# Patient Record
Sex: Female | Born: 1945 | Race: White | Hispanic: No | Smoking: Never smoker
Health system: Southern US, Community
[De-identification: ages and names within clinical notes are randomized; demographics above are authoritative.]

## PROBLEM LIST (undated history)

## (undated) DIAGNOSIS — Z85828 Personal history of other malignant neoplasm of skin: Secondary | ICD-10-CM

## (undated) DIAGNOSIS — I10 Essential (primary) hypertension: Secondary | ICD-10-CM

## (undated) DIAGNOSIS — K219 Gastro-esophageal reflux disease without esophagitis: Secondary | ICD-10-CM

## (undated) DIAGNOSIS — K2970 Gastritis, unspecified, without bleeding: Secondary | ICD-10-CM

## (undated) DIAGNOSIS — I209 Angina pectoris, unspecified: Secondary | ICD-10-CM

## (undated) DIAGNOSIS — Z86018 Personal history of other benign neoplasm: Secondary | ICD-10-CM

## (undated) DIAGNOSIS — L570 Actinic keratosis: Secondary | ICD-10-CM

## (undated) DIAGNOSIS — C801 Malignant (primary) neoplasm, unspecified: Secondary | ICD-10-CM

## (undated) DIAGNOSIS — Z8619 Personal history of other infectious and parasitic diseases: Secondary | ICD-10-CM

## (undated) DIAGNOSIS — G43909 Migraine, unspecified, not intractable, without status migrainosus: Secondary | ICD-10-CM

## (undated) DIAGNOSIS — G5600 Carpal tunnel syndrome, unspecified upper limb: Secondary | ICD-10-CM

## (undated) HISTORY — PX: CHOLECYSTECTOMY: SHX55

## (undated) HISTORY — DX: Migraine, unspecified, not intractable, without status migrainosus: G43.909

## (undated) HISTORY — PX: UPPER GI ENDOSCOPY: SHX6162

## (undated) HISTORY — DX: Carpal tunnel syndrome, unspecified upper limb: G56.00

## (undated) HISTORY — PX: ORIF SHOULDER FRACTURE: SHX5035

## (undated) HISTORY — PX: FOOT SURGERY: SHX648

## (undated) HISTORY — PX: FRACTURE SURGERY: SHX138

## (undated) HISTORY — PX: GALLBLADDER SURGERY: SHX652

## (undated) HISTORY — DX: Essential (primary) hypertension: I10

## (undated) HISTORY — DX: Angina pectoris, unspecified: I20.9

## (undated) HISTORY — DX: Actinic keratosis: L57.0

## (undated) HISTORY — DX: Gastro-esophageal reflux disease without esophagitis: K21.9

## (undated) HISTORY — DX: Personal history of other infectious and parasitic diseases: Z86.19

## (undated) HISTORY — PX: OTHER SURGICAL HISTORY: SHX169

## (undated) HISTORY — DX: Gastritis, unspecified, without bleeding: K29.70

---
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
## 1984-03-10 HISTORY — PX: VAGINAL HYSTERECTOMY: SUR661

## 1998-09-27 ENCOUNTER — Other Ambulatory Visit: Admission: RE | Admit: 1998-09-27 | Discharge: 1998-09-27 | Payer: Self-pay | Admitting: *Deleted

## 1998-10-16 ENCOUNTER — Other Ambulatory Visit: Admission: RE | Admit: 1998-10-16 | Discharge: 1998-10-16 | Payer: Self-pay | Admitting: *Deleted

## 2004-09-18 ENCOUNTER — Ambulatory Visit: Payer: Self-pay

## 2004-12-19 ENCOUNTER — Ambulatory Visit: Payer: Self-pay | Admitting: Family Medicine

## 2006-02-04 ENCOUNTER — Ambulatory Visit: Payer: Self-pay | Admitting: Family Medicine

## 2006-02-06 ENCOUNTER — Ambulatory Visit: Payer: Self-pay | Admitting: Family Medicine

## 2006-04-22 ENCOUNTER — Ambulatory Visit: Payer: Self-pay | Admitting: Otolaryngology

## 2006-08-07 ENCOUNTER — Ambulatory Visit: Payer: Self-pay | Admitting: Unknown Physician Specialty

## 2007-01-07 DIAGNOSIS — Z86018 Personal history of other benign neoplasm: Secondary | ICD-10-CM

## 2007-01-07 HISTORY — DX: Personal history of other benign neoplasm: Z86.018

## 2007-07-27 ENCOUNTER — Ambulatory Visit: Payer: Self-pay | Admitting: Family Medicine

## 2008-07-27 ENCOUNTER — Ambulatory Visit: Payer: Self-pay | Admitting: Family Medicine

## 2009-08-01 ENCOUNTER — Ambulatory Visit: Payer: Self-pay | Admitting: Family Medicine

## 2010-09-25 ENCOUNTER — Ambulatory Visit: Payer: Self-pay | Admitting: Family Medicine

## 2011-09-29 ENCOUNTER — Ambulatory Visit: Payer: Self-pay | Admitting: Family Medicine

## 2012-05-07 ENCOUNTER — Encounter: Payer: Self-pay | Admitting: *Deleted

## 2012-05-11 ENCOUNTER — Ambulatory Visit (INDEPENDENT_AMBULATORY_CARE_PROVIDER_SITE_OTHER): Payer: BC Managed Care – PPO | Admitting: Cardiovascular Disease

## 2012-05-11 ENCOUNTER — Encounter: Payer: Self-pay | Admitting: Cardiovascular Disease

## 2012-05-11 VITALS — BP 140/90 | HR 72 | Ht 64.5 in | Wt 234.3 lb

## 2012-05-11 DIAGNOSIS — R079 Chest pain, unspecified: Secondary | ICD-10-CM | POA: Insufficient documentation

## 2012-05-11 DIAGNOSIS — R0602 Shortness of breath: Secondary | ICD-10-CM

## 2012-05-11 DIAGNOSIS — I10 Essential (primary) hypertension: Secondary | ICD-10-CM | POA: Insufficient documentation

## 2012-05-11 NOTE — Assessment & Plan Note (Signed)
She has atypical chest pain of unclear etiology. Her physical exam is unremarkable and resting EKG is normal. Differential diagnosis include musculoskeletal or chest pain due to GERD. Atypical angina is also a possibility. Thus, I recommend further evaluation with a pharmacologic nuclear stress test. She is not able to exercise on a treadmill due to lower extremity arthritis. In the meanwhile, continue aspirin daily and metoprolol.

## 2012-05-11 NOTE — Assessment & Plan Note (Signed)
Her blood pressure is reasonably controlled on current medications.

## 2012-05-11 NOTE — Patient Instructions (Addendum)
Your physician has requested that you have a lexiscan myoview. For further information please visit [email or web address]. Please follow instruction sheet, as given.  Follow up as needed 

## 2012-05-11 NOTE — Progress Notes (Signed)
Primary care physician: Dr. Jerl Mina  HPI  This is a pleasant 67 year old female who is self-referred for evaluation of chest pain. She has no previous cardiac history. She reports having a stress test more than 7 years ago for atypical chest pain. She has known history of hypertension reasonably controlled with medications. She does have history of elevated blood sugar only in the morning but no diabetes. She is not a smoker. There is family history of coronary artery disease but not prematurely. She had chest pain which started about 10 days ago on a Sunday. It happened at rest and was described as aching and tightness feeling in the left upper chest area. It lasted all afternoon and persisted at the next day. There was no heartburn. The symptoms were different from her reflux. There was no radiation. She had milder recurrent chest discomfort since then some of which has been with physical activities. She has no associated dyspnea, nausea or diaphoresis. The discomfort also get worse with certain movements and bending down. She tries to exercise regularly but is limited by arthritis and cannot exercise on a treadmill due to that.  Allergies  Allergen Reactions  . Aleve (Naproxen Sodium)   . Augmentin (Amoxicillin-Pot Clavulanate)   . Naprosyn (Naproxen)      Current Outpatient Prescriptions on File Prior to Visit  Medication Sig Dispense Refill  . metoprolol succinate (TOPROL-XL) 50 MG 24 hr tablet Take 50 mg by mouth daily. Take with or immediately following a meal.      . valsartan-hydrochlorothiazide (DIOVAN-HCT) 160-12.5 MG per tablet Take 1 tablet by mouth daily.       No current facility-administered medications on file prior to visit.     Past Medical History  Diagnosis Date  . History of hepatitis A   . Migraines   . Carpal tunnel syndrome   . Other and unspecified angina pectoris   . Gastritis   . Unspecified essential hypertension   . GERD (gastroesophageal reflux  disease)      Past Surgical History  Procedure Laterality Date  . Basel cell removal    . Gallbladder surgery    . Vaginal hysterectomy  1986    endometriosis  . Orif shoulder fracture    . Foot surgery      x2  . Colonoscopy    . Upper gi endoscopy       Family History  Problem Relation Age of Onset  . Hypertension Mother   . Arrhythmia Brother   . Hyperlipidemia Brother   . Hypertension Brother      History   Social History  . Marital Status: Unknown    Spouse Name: N/A    Number of Children: N/A  . Years of Education: N/A   Occupational History  . Not on file.   Social History Main Topics  . Smoking status: Never Smoker   . Smokeless tobacco: Not on file  . Alcohol Use: No  . Drug Use: No  . Sexually Active: Not on file   Other Topics Concern  . Not on file   Social History Narrative  . No narrative on file     ROS Constitutional: Negative for fever, chills, diaphoresis, activity change, appetite change and fatigue.  HENT: Negative for hearing loss, nosebleeds, congestion, sore throat, facial swelling, drooling, trouble swallowing, neck pain, voice change, sinus pressure and tinnitus.  Eyes: Negative for photophobia, pain, discharge and visual disturbance.  Respiratory: Negative for apnea, cough , shortness of breath and  wheezing.  Cardiovascular: Negative for chest pain, palpitations and leg swelling.  Gastrointestinal: Negative for nausea, vomiting, abdominal pain, diarrhea, constipation, blood in stool and abdominal distention.  Genitourinary: Negative for dysuria, urgency, frequency, hematuria and decreased urine volume.  Musculoskeletal: Negative for myalgias, back pain, joint swelling, arthralgias and gait problem.  Skin: Negative for color change, pallor, rash and wound.  Neurological: Negative for dizziness, tremors, seizures, syncope, speech difficulty, weakness, light-headedness, numbness and headaches.  Psychiatric/Behavioral: Negative for  suicidal ideas, hallucinations, behavioral problems and agitation. The patient is not nervous/anxious.     PHYSICAL EXAM   BP 140/90  Pulse 72  Ht 5' 4.5" (1.638 m)  Wt 234 lb 5 oz (106.283 kg)  BMI 39.61 kg/m2 Constitutional: She is oriented to person, place, and time. She appears well-developed and well-nourished. No distress.  HENT: No nasal discharge.  Head: Normocephalic and atraumatic.  Eyes: Pupils are equal and round. Right eye exhibits no discharge. Left eye exhibits no discharge.  Neck: Normal range of motion. Neck supple. No JVD present. No thyromegaly present.  Cardiovascular: Normal rate, regular rhythm, normal heart sounds. Exam reveals no gallop and no friction rub. No murmur heard.  Pulmonary/Chest: Effort normal and breath sounds normal. No stridor. No respiratory distress. She has no wheezes. She has no rales. She exhibits no tenderness.  Abdominal: Soft. Bowel sounds are normal. She exhibits no distension. There is no tenderness. There is no rebound and no guarding.  Musculoskeletal: Normal range of motion. She exhibits no edema and no tenderness.  Neurological: She is alert and oriented to person, place, and time. Coordination normal.  Skin: Skin is warm and dry. No rash noted. She is not diaphoretic. No erythema. No pallor.  Psychiatric: She has a normal mood and affect. Her behavior is normal. Judgment and thought content normal.     EKG: Normal sinus rhythm with no significant ST or T wave changes.   ASSESSMENT AND PLAN

## 2012-05-21 ENCOUNTER — Ambulatory Visit: Payer: Self-pay | Admitting: Cardiovascular Disease

## 2012-05-21 DIAGNOSIS — R079 Chest pain, unspecified: Secondary | ICD-10-CM

## 2012-05-24 ENCOUNTER — Other Ambulatory Visit: Payer: Self-pay

## 2012-05-24 ENCOUNTER — Telehealth: Payer: Self-pay

## 2012-05-24 DIAGNOSIS — R079 Chest pain, unspecified: Secondary | ICD-10-CM

## 2012-05-24 NOTE — Telephone Encounter (Signed)
If she is still having chest pain, then the best option would be to proceed with cardiac cath. CTA of the coronary arteries is probably an option. But there is a possibility of artifacts on that too.

## 2012-05-24 NOTE — Telephone Encounter (Signed)
Pt informed Says she is still having intermittent CP Would like the other test done I told her I would find out what test and call her back

## 2012-05-24 NOTE — Telephone Encounter (Signed)
Message copied by [facility], Jayliana Valencia E on Mon May 24, 2012 10:26 AM ------      Message from: Lorine Bears A      Created: Fri May 21, 2012  7:44 PM       Her stress test was overall OK but there was one area of the heart that I could not see very well because of breast shadow. Let me know if she is still having chest pain as we might need to order a different test.  ------

## 2012-05-24 NOTE — Telephone Encounter (Signed)
See below

## 2012-05-25 ENCOUNTER — Other Ambulatory Visit: Payer: Self-pay

## 2012-05-25 ENCOUNTER — Other Ambulatory Visit: Payer: Self-pay | Admitting: Cardiovascular Disease

## 2012-05-25 DIAGNOSIS — R079 Chest pain, unspecified: Secondary | ICD-10-CM

## 2012-05-25 NOTE — Telephone Encounter (Signed)
Faxing info to # provided

## 2012-05-25 NOTE — Telephone Encounter (Signed)
Pt informed She wishes to proceed with CTA coronary arteries I will call to schedule this for anytime prior to 3/27 or after 3/30 and call her back

## 2012-05-25 NOTE — Telephone Encounter (Signed)
I spoke with dept and they ask that I fax info to [phone]

## 2012-05-25 NOTE — Telephone Encounter (Signed)
Attempted to reach Crystal [phone]) at [facility] to schedule Cardiac CT No answer

## 2012-05-25 NOTE — Telephone Encounter (Signed)
Attempted to schedule cardiac CTA at [facility] busy Will call back at [phone]

## 2012-05-25 NOTE — Telephone Encounter (Signed)
No answer at [facility] MRI scheduling

## 2012-05-25 NOTE — Telephone Encounter (Signed)
lmtcb

## 2012-05-26 ENCOUNTER — Ambulatory Visit (INDEPENDENT_AMBULATORY_CARE_PROVIDER_SITE_OTHER): Payer: BC Managed Care – PPO

## 2012-05-26 ENCOUNTER — Encounter: Payer: Self-pay | Admitting: *Deleted

## 2012-05-26 ENCOUNTER — Other Ambulatory Visit: Payer: Self-pay

## 2012-05-26 DIAGNOSIS — R079 Chest pain, unspecified: Secondary | ICD-10-CM

## 2012-05-26 NOTE — Telephone Encounter (Signed)
Per charanda in [place] office this has been scheduled and pt is aware Bmp today

## 2012-05-27 LAB — BASIC METABOLIC PANEL
BUN/Creatinine Ratio: 17 (ref 11–26)
BUN: 18 mg/dL (ref 8–27)
CO2: 20 mmol/L (ref 19–28)
Calcium: 8.9 mg/dL (ref 8.6–10.2)
Chloride: 101 mmol/L (ref 97–108)
Creatinine, Ser: 1.07 mg/dL — ABNORMAL HIGH (ref 0.57–1.00)
GFR calc Af Amer: 63 mL/min/{1.73_m2} (ref >59)
GFR calc non Af Amer: 55 mL/min/{1.73_m2} — ABNORMAL LOW (ref >59)
Glucose: 74 mg/dL (ref 65–99)
Potassium: 4.1 mmol/L (ref 3.5–5.2)
Sodium: 140 mmol/L (ref 134–144)

## 2012-06-04 ENCOUNTER — Ambulatory Visit (HOSPITAL_COMMUNITY)
Admission: RE | Admit: 2012-06-04 | Discharge: 2012-06-04 | Disposition: A | Discharge status: Home / Self Care | Payer: BC Managed Care – PPO | Source: Ambulatory Visit | Attending: Cardiovascular Disease | Admitting: Cardiovascular Disease

## 2012-06-04 DIAGNOSIS — R079 Chest pain, unspecified: Secondary | ICD-10-CM | POA: Insufficient documentation

## 2012-06-04 MED ORDER — METOPROLOL TARTRATE 1 MG/ML IV SOLN
5.0000 mg | INTRAVENOUS | Status: DC | PRN
  Administered 2012-06-04 (×2): 5 mg via INTRAVENOUS
  Filled 2012-06-04: qty 5

## 2012-06-04 MED ORDER — IOHEXOL 350 MG/ML SOLN
100.0000 mL | Freq: Once | INTRAVENOUS | Status: AC | PRN
  Administered 2012-06-04: 80 mL via INTRAVENOUS

## 2012-06-04 MED ORDER — NITROGLYCERIN 0.4 MG SL SUBL
SUBLINGUAL_TABLET | SUBLINGUAL | Status: AC
  Administered 2012-06-04: 14:00:00
  Filled 2012-06-04: qty 25

## 2012-06-04 MED ORDER — METOPROLOL TARTRATE 1 MG/ML IV SOLN
INTRAVENOUS | Status: AC
  Administered 2012-06-04: 5 mg
  Filled 2012-06-04: qty 15

## 2012-09-29 ENCOUNTER — Ambulatory Visit: Payer: Self-pay | Admitting: Family Medicine

## 2013-01-13 ENCOUNTER — Other Ambulatory Visit: Payer: Self-pay

## 2013-11-01 ENCOUNTER — Ambulatory Visit: Payer: Self-pay | Admitting: Family Medicine

## 2014-09-04 ENCOUNTER — Other Ambulatory Visit: Payer: Self-pay

## 2014-10-30 ENCOUNTER — Other Ambulatory Visit: Payer: Self-pay | Admitting: Family Medicine

## 2014-10-30 DIAGNOSIS — Z1231 Encounter for screening mammogram for malignant neoplasm of breast: Secondary | ICD-10-CM

## 2014-11-06 ENCOUNTER — Ambulatory Visit
Admission: RE | Admit: 2014-11-06 | Discharge: 2014-11-06 | Disposition: A | Discharge status: Home / Self Care | Payer: Medicare Other | Source: Ambulatory Visit | Attending: Family Medicine | Admitting: Family Medicine

## 2014-11-06 ENCOUNTER — Other Ambulatory Visit: Payer: Self-pay | Admitting: Family Medicine

## 2014-11-06 DIAGNOSIS — Z1231 Encounter for screening mammogram for malignant neoplasm of breast: Secondary | ICD-10-CM

## 2014-11-06 HISTORY — DX: Malignant (primary) neoplasm, unspecified: C80.1

## 2015-04-05 DIAGNOSIS — H1089 Other conjunctivitis: Secondary | ICD-10-CM | POA: Diagnosis not present

## 2015-04-12 DIAGNOSIS — H1089 Other conjunctivitis: Secondary | ICD-10-CM | POA: Diagnosis not present

## 2015-04-23 DIAGNOSIS — I10 Essential (primary) hypertension: Secondary | ICD-10-CM | POA: Diagnosis not present

## 2015-04-23 DIAGNOSIS — E119 Type 2 diabetes mellitus without complications: Secondary | ICD-10-CM | POA: Diagnosis not present

## 2015-05-01 DIAGNOSIS — N390 Urinary tract infection, site not specified: Secondary | ICD-10-CM | POA: Diagnosis not present

## 2015-05-01 DIAGNOSIS — E785 Hyperlipidemia, unspecified: Secondary | ICD-10-CM | POA: Diagnosis not present

## 2015-05-01 DIAGNOSIS — R739 Hyperglycemia, unspecified: Secondary | ICD-10-CM | POA: Diagnosis not present

## 2015-05-01 DIAGNOSIS — I10 Essential (primary) hypertension: Secondary | ICD-10-CM | POA: Diagnosis not present

## 2015-05-01 DIAGNOSIS — Z0000 Encounter for general adult medical examination without abnormal findings: Secondary | ICD-10-CM | POA: Diagnosis not present

## 2015-06-25 DIAGNOSIS — H524 Presbyopia: Secondary | ICD-10-CM | POA: Diagnosis not present

## 2015-08-21 DIAGNOSIS — J019 Acute sinusitis, unspecified: Secondary | ICD-10-CM | POA: Diagnosis not present

## 2015-08-21 DIAGNOSIS — B9689 Other specified bacterial agents as the cause of diseases classified elsewhere: Secondary | ICD-10-CM | POA: Diagnosis not present

## 2015-10-30 DIAGNOSIS — R739 Hyperglycemia, unspecified: Secondary | ICD-10-CM | POA: Diagnosis not present

## 2015-11-06 DIAGNOSIS — I10 Essential (primary) hypertension: Secondary | ICD-10-CM | POA: Diagnosis not present

## 2015-11-06 DIAGNOSIS — R739 Hyperglycemia, unspecified: Secondary | ICD-10-CM | POA: Diagnosis not present

## 2015-11-07 ENCOUNTER — Other Ambulatory Visit: Payer: Self-pay | Admitting: Family Medicine

## 2015-11-07 DIAGNOSIS — Z1231 Encounter for screening mammogram for malignant neoplasm of breast: Secondary | ICD-10-CM

## 2015-11-27 ENCOUNTER — Ambulatory Visit
Admission: RE | Admit: 2015-11-27 | Discharge: 2015-11-27 | Disposition: A | Discharge status: Home / Self Care | Payer: PPO | Source: Ambulatory Visit | Attending: Family Medicine | Admitting: Family Medicine

## 2015-11-27 ENCOUNTER — Other Ambulatory Visit: Payer: Self-pay | Admitting: Family Medicine

## 2015-11-27 DIAGNOSIS — Z1231 Encounter for screening mammogram for malignant neoplasm of breast: Secondary | ICD-10-CM | POA: Insufficient documentation

## 2016-02-04 DIAGNOSIS — I8393 Asymptomatic varicose veins of bilateral lower extremities: Secondary | ICD-10-CM | POA: Diagnosis not present

## 2016-02-04 DIAGNOSIS — L739 Follicular disorder, unspecified: Secondary | ICD-10-CM | POA: Diagnosis not present

## 2016-02-04 DIAGNOSIS — Z1283 Encounter for screening for malignant neoplasm of skin: Secondary | ICD-10-CM | POA: Diagnosis not present

## 2016-02-04 DIAGNOSIS — D1800 Hemangioma unspecified site: Secondary | ICD-10-CM | POA: Diagnosis not present

## 2016-02-04 DIAGNOSIS — L821 Other seborrheic keratosis: Secondary | ICD-10-CM | POA: Diagnosis not present

## 2016-02-04 DIAGNOSIS — D485 Neoplasm of uncertain behavior of skin: Secondary | ICD-10-CM | POA: Diagnosis not present

## 2016-02-04 DIAGNOSIS — Z85828 Personal history of other malignant neoplasm of skin: Secondary | ICD-10-CM | POA: Diagnosis not present

## 2016-02-04 DIAGNOSIS — L820 Inflamed seborrheic keratosis: Secondary | ICD-10-CM | POA: Diagnosis not present

## 2016-02-04 DIAGNOSIS — L570 Actinic keratosis: Secondary | ICD-10-CM | POA: Diagnosis not present

## 2016-02-04 DIAGNOSIS — D229 Melanocytic nevi, unspecified: Secondary | ICD-10-CM | POA: Diagnosis not present

## 2016-02-04 DIAGNOSIS — L578 Other skin changes due to chronic exposure to nonionizing radiation: Secondary | ICD-10-CM | POA: Diagnosis not present

## 2016-02-04 DIAGNOSIS — L812 Freckles: Secondary | ICD-10-CM | POA: Diagnosis not present

## 2016-03-17 DIAGNOSIS — J019 Acute sinusitis, unspecified: Secondary | ICD-10-CM | POA: Diagnosis not present

## 2016-04-29 DIAGNOSIS — I10 Essential (primary) hypertension: Secondary | ICD-10-CM | POA: Diagnosis not present

## 2016-04-29 DIAGNOSIS — R739 Hyperglycemia, unspecified: Secondary | ICD-10-CM | POA: Diagnosis not present

## 2016-04-30 DIAGNOSIS — R3989 Other symptoms and signs involving the genitourinary system: Secondary | ICD-10-CM | POA: Diagnosis not present

## 2016-05-06 DIAGNOSIS — E785 Hyperlipidemia, unspecified: Secondary | ICD-10-CM | POA: Diagnosis not present

## 2016-05-06 DIAGNOSIS — Z0000 Encounter for general adult medical examination without abnormal findings: Secondary | ICD-10-CM | POA: Diagnosis not present

## 2016-05-06 DIAGNOSIS — R300 Dysuria: Secondary | ICD-10-CM | POA: Diagnosis not present

## 2016-05-06 DIAGNOSIS — E119 Type 2 diabetes mellitus without complications: Secondary | ICD-10-CM | POA: Diagnosis not present

## 2016-05-06 DIAGNOSIS — I10 Essential (primary) hypertension: Secondary | ICD-10-CM | POA: Diagnosis not present

## 2016-05-06 DIAGNOSIS — F439 Reaction to severe stress, unspecified: Secondary | ICD-10-CM | POA: Diagnosis not present

## 2016-05-08 DIAGNOSIS — L821 Other seborrheic keratosis: Secondary | ICD-10-CM | POA: Diagnosis not present

## 2016-05-08 DIAGNOSIS — L570 Actinic keratosis: Secondary | ICD-10-CM | POA: Diagnosis not present

## 2016-05-08 DIAGNOSIS — L820 Inflamed seborrheic keratosis: Secondary | ICD-10-CM | POA: Diagnosis not present

## 2016-05-08 DIAGNOSIS — L578 Other skin changes due to chronic exposure to nonionizing radiation: Secondary | ICD-10-CM | POA: Diagnosis not present

## 2016-06-24 DIAGNOSIS — H04552 Acquired stenosis of left nasolacrimal duct: Secondary | ICD-10-CM | POA: Diagnosis not present

## 2016-07-03 DIAGNOSIS — H01004 Unspecified blepharitis left upper eyelid: Secondary | ICD-10-CM | POA: Diagnosis not present

## 2016-08-11 DIAGNOSIS — L820 Inflamed seborrheic keratosis: Secondary | ICD-10-CM | POA: Diagnosis not present

## 2016-08-11 DIAGNOSIS — L578 Other skin changes due to chronic exposure to nonionizing radiation: Secondary | ICD-10-CM | POA: Diagnosis not present

## 2016-08-11 DIAGNOSIS — L821 Other seborrheic keratosis: Secondary | ICD-10-CM | POA: Diagnosis not present

## 2016-08-11 DIAGNOSIS — L570 Actinic keratosis: Secondary | ICD-10-CM | POA: Diagnosis not present

## 2016-09-01 DIAGNOSIS — E113212 Type 2 diabetes mellitus with mild nonproliferative diabetic retinopathy with macular edema, left eye: Secondary | ICD-10-CM | POA: Diagnosis not present

## 2016-09-01 DIAGNOSIS — H04123 Dry eye syndrome of bilateral lacrimal glands: Secondary | ICD-10-CM | POA: Diagnosis not present

## 2016-09-03 DIAGNOSIS — Z0000 Encounter for general adult medical examination without abnormal findings: Secondary | ICD-10-CM | POA: Diagnosis not present

## 2016-09-16 DIAGNOSIS — J029 Acute pharyngitis, unspecified: Secondary | ICD-10-CM | POA: Diagnosis not present

## 2016-09-22 ENCOUNTER — Other Ambulatory Visit: Payer: Self-pay | Admitting: Medical

## 2016-09-22 ENCOUNTER — Other Ambulatory Visit: Payer: Self-pay | Admitting: Family Medicine

## 2016-09-22 DIAGNOSIS — E041 Nontoxic single thyroid nodule: Secondary | ICD-10-CM

## 2016-09-23 ENCOUNTER — Ambulatory Visit: Payer: PPO

## 2016-09-23 DIAGNOSIS — E041 Nontoxic single thyroid nodule: Secondary | ICD-10-CM | POA: Diagnosis not present

## 2016-09-26 DIAGNOSIS — H6123 Impacted cerumen, bilateral: Secondary | ICD-10-CM | POA: Diagnosis not present

## 2016-09-26 DIAGNOSIS — E041 Nontoxic single thyroid nodule: Secondary | ICD-10-CM | POA: Diagnosis not present

## 2016-10-13 DIAGNOSIS — H5712 Ocular pain, left eye: Secondary | ICD-10-CM | POA: Diagnosis not present

## 2016-10-22 DIAGNOSIS — E041 Nontoxic single thyroid nodule: Secondary | ICD-10-CM | POA: Diagnosis not present

## 2016-10-23 DIAGNOSIS — E042 Nontoxic multinodular goiter: Secondary | ICD-10-CM | POA: Diagnosis not present

## 2016-10-28 DIAGNOSIS — E119 Type 2 diabetes mellitus without complications: Secondary | ICD-10-CM | POA: Diagnosis not present

## 2016-11-03 DIAGNOSIS — E041 Nontoxic single thyroid nodule: Secondary | ICD-10-CM | POA: Diagnosis not present

## 2016-11-03 DIAGNOSIS — I10 Essential (primary) hypertension: Secondary | ICD-10-CM | POA: Diagnosis not present

## 2016-11-03 DIAGNOSIS — R1310 Dysphagia, unspecified: Secondary | ICD-10-CM | POA: Diagnosis not present

## 2016-11-03 DIAGNOSIS — E119 Type 2 diabetes mellitus without complications: Secondary | ICD-10-CM | POA: Diagnosis not present

## 2017-01-09 DIAGNOSIS — Z8371 Family history of colonic polyps: Secondary | ICD-10-CM | POA: Diagnosis not present

## 2017-01-09 DIAGNOSIS — Z86010 Personal history of colonic polyps: Secondary | ICD-10-CM | POA: Diagnosis not present

## 2017-01-09 DIAGNOSIS — Z800 Family history of malignant neoplasm of digestive organs: Secondary | ICD-10-CM | POA: Diagnosis not present

## 2017-01-09 DIAGNOSIS — R1310 Dysphagia, unspecified: Secondary | ICD-10-CM | POA: Diagnosis not present

## 2017-01-26 ENCOUNTER — Other Ambulatory Visit: Payer: Self-pay | Admitting: Family Medicine

## 2017-01-26 DIAGNOSIS — Z1231 Encounter for screening mammogram for malignant neoplasm of breast: Secondary | ICD-10-CM

## 2017-02-02 DIAGNOSIS — L578 Other skin changes due to chronic exposure to nonionizing radiation: Secondary | ICD-10-CM | POA: Diagnosis not present

## 2017-02-02 DIAGNOSIS — D1801 Hemangioma of skin and subcutaneous tissue: Secondary | ICD-10-CM | POA: Diagnosis not present

## 2017-02-02 DIAGNOSIS — D229 Melanocytic nevi, unspecified: Secondary | ICD-10-CM | POA: Diagnosis not present

## 2017-02-02 DIAGNOSIS — L821 Other seborrheic keratosis: Secondary | ICD-10-CM | POA: Diagnosis not present

## 2017-02-02 DIAGNOSIS — L309 Dermatitis, unspecified: Secondary | ICD-10-CM | POA: Diagnosis not present

## 2017-02-02 DIAGNOSIS — L812 Freckles: Secondary | ICD-10-CM | POA: Diagnosis not present

## 2017-02-02 DIAGNOSIS — I8393 Asymptomatic varicose veins of bilateral lower extremities: Secondary | ICD-10-CM | POA: Diagnosis not present

## 2017-02-02 DIAGNOSIS — Z1283 Encounter for screening for malignant neoplasm of skin: Secondary | ICD-10-CM | POA: Diagnosis not present

## 2017-02-02 DIAGNOSIS — L918 Other hypertrophic disorders of the skin: Secondary | ICD-10-CM | POA: Diagnosis not present

## 2017-02-02 DIAGNOSIS — L570 Actinic keratosis: Secondary | ICD-10-CM | POA: Diagnosis not present

## 2017-02-02 DIAGNOSIS — Z85828 Personal history of other malignant neoplasm of skin: Secondary | ICD-10-CM | POA: Diagnosis not present

## 2017-02-26 ENCOUNTER — Ambulatory Visit
Admission: RE | Admit: 2017-02-26 | Discharge: 2017-02-26 | Disposition: A | Discharge status: Home / Self Care | Payer: PPO | Source: Ambulatory Visit | Attending: Family Medicine | Admitting: Family Medicine

## 2017-02-26 DIAGNOSIS — Z1231 Encounter for screening mammogram for malignant neoplasm of breast: Secondary | ICD-10-CM | POA: Insufficient documentation

## 2017-03-30 ENCOUNTER — Ambulatory Visit
Admission: RE | Admit: 2017-03-30 | Discharge: 2017-03-30 | Disposition: A | Discharge status: Home / Self Care | Payer: PPO | Source: Ambulatory Visit | Attending: Unknown Physician Specialty | Admitting: Unknown Physician Specialty

## 2017-03-30 ENCOUNTER — Ambulatory Visit: Payer: PPO | Admitting: Anesthesiology

## 2017-03-30 ENCOUNTER — Encounter: Payer: Self-pay | Admitting: *Deleted

## 2017-03-30 ENCOUNTER — Encounter
Admission: RE | Disposition: A | Discharge status: Home / Self Care | Payer: Self-pay | Source: Ambulatory Visit | Attending: Unknown Physician Specialty

## 2017-03-30 DIAGNOSIS — K5730 Diverticulosis of large intestine without perforation or abscess without bleeding: Secondary | ICD-10-CM | POA: Diagnosis not present

## 2017-03-30 DIAGNOSIS — K640 First degree hemorrhoids: Secondary | ICD-10-CM | POA: Diagnosis not present

## 2017-03-30 DIAGNOSIS — K2950 Unspecified chronic gastritis without bleeding: Secondary | ICD-10-CM | POA: Diagnosis not present

## 2017-03-30 DIAGNOSIS — K648 Other hemorrhoids: Secondary | ICD-10-CM | POA: Diagnosis not present

## 2017-03-30 DIAGNOSIS — K210 Gastro-esophageal reflux disease with esophagitis: Secondary | ICD-10-CM | POA: Diagnosis not present

## 2017-03-30 DIAGNOSIS — R1310 Dysphagia, unspecified: Secondary | ICD-10-CM | POA: Diagnosis not present

## 2017-03-30 DIAGNOSIS — K2970 Gastritis, unspecified, without bleeding: Secondary | ICD-10-CM | POA: Insufficient documentation

## 2017-03-30 DIAGNOSIS — K2960 Other gastritis without bleeding: Secondary | ICD-10-CM | POA: Diagnosis not present

## 2017-03-30 DIAGNOSIS — Z86010 Personal history of colonic polyps: Secondary | ICD-10-CM | POA: Diagnosis not present

## 2017-03-30 DIAGNOSIS — Z1211 Encounter for screening for malignant neoplasm of colon: Secondary | ICD-10-CM | POA: Diagnosis not present

## 2017-03-30 DIAGNOSIS — K5790 Diverticulosis of intestine, part unspecified, without perforation or abscess without bleeding: Secondary | ICD-10-CM | POA: Diagnosis not present

## 2017-03-30 HISTORY — PX: COLONOSCOPY WITH PROPOFOL: SHX5780

## 2017-03-30 HISTORY — PX: ESOPHAGOGASTRODUODENOSCOPY (EGD) WITH PROPOFOL: SHX5813

## 2017-03-30 SURGERY — ESOPHAGOGASTRODUODENOSCOPY (EGD) WITH PROPOFOL
Anesthesia: General

## 2017-03-30 MED ORDER — PROPOFOL 10 MG/ML IV BOLUS
INTRAVENOUS | Status: DC | PRN
  Administered 2017-03-30: 30 mg via INTRAVENOUS
  Administered 2017-03-30: 50 mg via INTRAVENOUS

## 2017-03-30 MED ORDER — SODIUM CHLORIDE 0.9 % IV SOLN: INTRAVENOUS | Status: DC

## 2017-03-30 MED ORDER — PROPOFOL 500 MG/50ML IV EMUL
INTRAVENOUS | Status: DC | PRN
  Administered 2017-03-30: 75 ug/kg/min via INTRAVENOUS

## 2017-03-30 MED ORDER — LIDOCAINE HCL (CARDIAC) 20 MG/ML IV SOLN
INTRAVENOUS | Status: DC | PRN
  Administered 2017-03-30: 30 mg via INTRAVENOUS

## 2017-03-30 MED ORDER — SODIUM CHLORIDE 0.9 % IV SOLN
INTRAVENOUS | Status: DC
  Administered 2017-03-30: 09:00:00 via INTRAVENOUS

## 2017-03-30 NOTE — Anesthesia Post-op Follow-up Note (Signed)
Anesthesia QCDR form completed.        

## 2017-03-30 NOTE — Anesthesia Preprocedure Evaluation (Signed)
Anesthesia Evaluation  Patient identified by MRN, date of birth, ID band Patient awake    Reviewed: Allergy & Precautions, NPO status , Patient's Chart, lab work & pertinent test results  History of Anesthesia Complications (+) PONV and history of anesthetic complications  Airway Mallampati: II  TM Distance: >3 FB Neck ROM: Full    Dental no notable dental hx.    Pulmonary neg pulmonary ROS, neg sleep apnea, neg COPD,    breath sounds clear to auscultation- rhonchi (-) wheezing      Cardiovascular hypertension, Pt. on medications (-) CAD, (-) Past MI, (-) Cardiac Stents and (-) CABG  Rhythm:Regular Rate:Normal - Systolic murmurs and - Diastolic murmurs    Neuro/Psych  Headaches, negative psych ROS   GI/Hepatic Neg liver ROS, GERD  ,  Endo/Other  negative endocrine ROSneg diabetes  Renal/GU negative Renal ROS     Musculoskeletal negative musculoskeletal ROS (+)   Abdominal (+) + obese,   Peds  Hematology negative hematology ROS (+)   Anesthesia Other Findings Past Medical History: No date: Cancer ([place])     Comment:  basal cell skin cancer No date: Carpal tunnel syndrome No date: Gastritis No date: GERD (gastroesophageal reflux disease) No date: History of hepatitis A No date: Migraines No date: Other and unspecified angina pectoris No date: Unspecified essential hypertension   Reproductive/Obstetrics                             Anesthesia Physical Anesthesia Plan  ASA: II  Anesthesia Plan: General   Post-op Pain Management:    Induction: Intravenous  PONV Risk Score and Plan: 3 and Propofol infusion  Airway Management Planned: Natural Airway  Additional Equipment:   Intra-op Plan:   Post-operative Plan:   Informed Consent: I have reviewed the patients History and Physical, chart, labs and discussed the procedure including the risks, benefits and alternatives for the  proposed anesthesia with the patient or authorized representative who has indicated his/her understanding and acceptance.   Dental advisory given  Plan Discussed with: CRNA and Anesthesiologist  Anesthesia Plan Comments:         Anesthesia Quick Evaluation

## 2017-03-30 NOTE — H&P (Signed)
Primary Care Physician:  Maryland Pink, MD Primary Gastroenterologist:  Dr. Vira Agar  Pre-Procedure History & Physical: HPI:  Cheryl Key is a 72 y.o. female is here for an endoscopy and colonoscopy.   Past Medical History:  Diagnosis Date  . Cancer ([place])    basal cell skin cancer  . Carpal tunnel syndrome   . Gastritis   . GERD (gastroesophageal reflux disease)   . History of hepatitis A   . Migraines   . Other and unspecified angina pectoris   . Unspecified essential hypertension     Past Surgical History:  Procedure Laterality Date  . basel cell removal    . colonoscopy    . FOOT SURGERY     x2  . GALLBLADDER SURGERY    . ORIF SHOULDER FRACTURE    . UPPER GI ENDOSCOPY    . VAGINAL HYSTERECTOMY  1986   endometriosis    Prior to Admission medications   Medication Sig Start Date End Date Taking? Authorizing Provider  calcium-vitamin D (OSCAL WITH D) 500-200 MG-UNIT tablet Take 1 tablet by mouth 2 (two) times daily.   Yes [provider]  fluticasone (FLONASE) 50 MCG/ACT nasal spray Place 2 sprays into both nostrils daily.   Yes [provider]  glucosamine-chondroitin 500-400 MG tablet Take 1 tablet by mouth 3 (three) times daily.   Yes [provider]  losartan-hydrochlorothiazide (HYZAAR) 50-12.5 MG tablet Take 1 tablet by mouth daily.   Yes [provider]  metoprolol succinate (TOPROL-XL) 50 MG 24 hr tablet Take 50 mg by mouth daily. Take with or immediately following a meal.   Yes [provider]  Multiple Vitamin (MULTIVITAMIN) tablet Take 1 tablet by mouth daily.   Yes [provider]    Allergies as of 02/23/2017 - Review Complete 06/04/2012  Allergen Reaction Noted  . Aleve [naproxen sodium]  05/07/2012  . Augmentin [amoxicillin-pot clavulanate]  05/07/2012  . Naprosyn [naproxen]  05/07/2012    Family History  Problem Relation Age of Onset  . Hypertension Mother   . Arrhythmia Brother   .  Hyperlipidemia Brother   . Hypertension Brother   . Breast cancer Neg Hx     Social History   Socioeconomic History  . Marital status: Married    Spouse name: Not on file  . Number of children: Not on file  . Years of education: Not on file  . Highest education level: Not on file  Social Needs  . Financial resource strain: Not on file  . Food insecurity - worry: Not on file  . Food insecurity - inability: Not on file  . Transportation needs - medical: Not on file  . Transportation needs - non-medical: Not on file  Occupational History  . Not on file  Tobacco Use  . Smoking status: Never Smoker  . Smokeless tobacco: Never Used  Substance and Sexual Activity  . Alcohol use: No  . Drug use: No  . Sexual activity: Not on file  Other Topics Concern  . Not on file  Social History Narrative  . Not on file    Review of Systems: See HPI, otherwise negative ROS  Physical Exam: BP 134/75   Pulse 85   Temp (!) 97 F (36.1 C) (Tympanic)   Resp 14   Ht 5' 4.5" (1.638 m)   Wt 103.4 kg (228 lb)   SpO2 100%   BMI 38.53 kg/m  General:   Alert,  pleasant and cooperative in NAD Head:  Normocephalic and atraumatic. Neck:  Supple; no masses or thyromegaly. Lungs:  Clear throughout to auscultation.    Heart:  Regular rate and rhythm. Abdomen:  Soft, nontender and nondistended. Normal bowel sounds, without guarding, and without rebound.   Neurologic:  Alert and  oriented x4;  grossly normal neurologically.  Impression/Plan: Cheryl Key is here for an endoscopy and colonoscopy to be performed for [facility] colon polyps and dysphagia.  Risks, benefits, limitations, and alternatives regarding  endoscopy and colonoscopy have been reviewed with the patient.  Questions have been answered.  All parties agreeable.   Gaylyn Cheers, MD  03/30/2017, 9:22 AM

## 2017-03-30 NOTE — Op Note (Addendum)
[facility] Gastroenterology Patient Name: Cheryl Key Procedure Date: 03/30/2017 9:14 AM MRN: [id number] Account #: [id number] Date of Birth: 02/03/1946 Admit Type: Outpatient Age: 72 Room: [facility] ENDO ROOM 1 Gender: Female Note Status: Finalized Procedure:            Upper GI endoscopy Indications:          Dysphagia Providers:            Manya Silvas, MD Referring MD:         Irven Easterly. Kary Kos, MD (Referring MD) Medicines:            Propofol per Anesthesia Complications:        No immediate complications. Procedure:            Pre-Anesthesia Assessment:                       - After reviewing the risks and benefits, the patient                        was deemed in satisfactory condition to undergo the                        procedure.                       After obtaining informed consent, the endoscope was                        passed under direct vision. Throughout the procedure,                        the patient's blood pressure, pulse, and oxygen                        saturations were monitored continuously. The Endoscope                        was introduced through the mouth, and advanced to the                        second part of duodenum. The upper GI endoscopy was                        accomplished without difficulty. The patient tolerated                        the procedure well. Findings:      LA Grade A (one or more mucosal breaks less than 5 mm, not extending       between tops of 2 mucosal folds) esophagitis with no bleeding was found       40 cm from the incisors. Biopsies were taken with a cold forceps for       histology. At the end of the procedure I passed a guide wire and pullled       the scope and then passed a 59F and a 58F Savary dilators with mild       resistance.      Localized mild inflammation characterized by erythema and granularity       was found on the greater curvature of the gastric antrum. Biopsies were   taken with a cold  forceps for histology. Biopsies were taken with a cold       forceps for Helicobacter pylori testing.      The examined duodenum was normal. Impression:           - LA Grade A reflux esophagitis. Rule out Barrett's                        esophagus. Biopsied.                       - Gastritis. Biopsied.                       - Normal examined duodenum. Recommendation:       - Await pathology results.                       - Perform a colonoscopy as previously scheduled. Manya Silvas, MD 03/30/2017 9:41:42 AM This report has been signed electronically. Number of Addenda: 0 Note Initiated On: 03/30/2017 9:14 AM      [facility]

## 2017-03-30 NOTE — Anesthesia Postprocedure Evaluation (Signed)
Anesthesia Post Note  Patient: Cheryl Key  Procedure(s) Performed: ESOPHAGOGASTRODUODENOSCOPY (EGD) WITH PROPOFOL (N/A ) COLONOSCOPY WITH PROPOFOL (N/A )  Patient location during evaluation: Endoscopy Anesthesia Type: General Level of consciousness: awake and alert and oriented Pain management: pain level controlled Vital Signs Assessment: post-procedure vital signs reviewed and stable Respiratory status: spontaneous breathing, nonlabored ventilation and respiratory function stable Cardiovascular status: blood pressure returned to baseline and stable Postop Assessment: no signs of nausea or vomiting Anesthetic complications: no     Last Vitals:  Vitals:   03/30/17 1020 03/30/17 1030  BP: 138/76 132/74  Pulse: 76 76  Resp: 15 15  Temp:    SpO2: 100% 100%    Last Pain:  Vitals:   03/30/17 1030  TempSrc:   PainSc: 3                  Lakrisha Iseman

## 2017-03-30 NOTE — Op Note (Signed)
[facility] Gastroenterology Patient Name: Cheryl Key Procedure Date: 03/30/2017 9:13 AM MRN: [id number] Account #: [id number] Date of Birth: Dec 28, 1945 Admit Type: Outpatient Age: 72 Room: [facility] ENDO ROOM 1 Gender: Female Note Status: Finalized Procedure:            Colonoscopy Indications:          High risk colon cancer surveillance: Personal history                        of colonic polyps Providers:            Manya Silvas, MD Medicines:            Propofol per Anesthesia Complications:        No immediate complications. Procedure:            Pre-Anesthesia Assessment:                       - After reviewing the risks and benefits, the patient                        was deemed in satisfactory condition to undergo the                        procedure.                       After obtaining informed consent, the colonoscope was                        passed under direct vision. Throughout the procedure,                        the patient's blood pressure, pulse, and oxygen                        saturations were monitored continuously. The                        Colonoscope was introduced through the anus and                        advanced to the the cecum, identified by appendiceal                        orifice and ileocecal valve. The colonoscopy was                        performed without difficulty. The patient tolerated the                        procedure well. The quality of the bowel preparation                        was excellent. Findings:      A few medium-mouthed diverticula were found in the sigmoid colon.      Internal hemorrhoids were found during endoscopy. The hemorrhoids were       small and Grade I (internal hemorrhoids that do not prolapse).      The exam was otherwise without abnormality. Impression:           - Diverticulosis  in the sigmoid colon.                       - Internal hemorrhoids.                       - The  examination was otherwise normal.                       - No specimens collected. Recommendation:       - Repeat colonoscopy in 5 years for surveillance. Manya Silvas, MD 03/30/2017 9:59:08 AM This report has been signed electronically. Number of Addenda: 0 Note Initiated On: 03/30/2017 9:13 AM Scope Withdrawal Time: 0 hours 5 minutes 37 seconds  Total Procedure Duration: 0 hours 12 minutes 35 seconds       [facility]

## 2017-03-30 NOTE — Transfer of Care (Signed)
Immediate Anesthesia Transfer of Care Note  Patient: Cheryl Key  Procedure(s) Performed: ESOPHAGOGASTRODUODENOSCOPY (EGD) WITH PROPOFOL (N/A ) COLONOSCOPY WITH PROPOFOL (N/A )  Patient Location: PACU and Endoscopy Unit  Anesthesia Type:General  Level of Consciousness: awake, alert  and oriented  Airway & Oxygen Therapy: Patient Spontanous Breathing  Post-op Assessment: Report given to RN and Post -op Vital signs reviewed and stable  Post vital signs: Reviewed and stable  Last Vitals:  Vitals:   03/30/17 0835  BP: 134/75  Pulse: 85  Resp: 14  Temp: (!) 36.1 C  SpO2: 100%    Last Pain:  Vitals:   03/30/17 0835  TempSrc: Tympanic         Complications: No apparent anesthesia complications

## 2017-03-31 ENCOUNTER — Encounter: Payer: Self-pay | Admitting: Unknown Physician Specialty

## 2017-03-31 LAB — SURGICAL PATHOLOGY

## 2017-05-19 DIAGNOSIS — I10 Essential (primary) hypertension: Secondary | ICD-10-CM | POA: Diagnosis not present

## 2017-05-19 DIAGNOSIS — Z79899 Other long term (current) drug therapy: Secondary | ICD-10-CM | POA: Diagnosis not present

## 2017-06-04 DIAGNOSIS — M7632 Iliotibial band syndrome, left leg: Secondary | ICD-10-CM | POA: Diagnosis not present

## 2017-06-04 DIAGNOSIS — M25562 Pain in left knee: Secondary | ICD-10-CM | POA: Insufficient documentation

## 2017-06-04 DIAGNOSIS — M1712 Unilateral primary osteoarthritis, left knee: Secondary | ICD-10-CM | POA: Diagnosis not present

## 2017-06-09 DIAGNOSIS — I10 Essential (primary) hypertension: Secondary | ICD-10-CM | POA: Diagnosis not present

## 2017-06-11 DIAGNOSIS — M7632 Iliotibial band syndrome, left leg: Secondary | ICD-10-CM | POA: Diagnosis not present

## 2017-06-11 DIAGNOSIS — M25562 Pain in left knee: Secondary | ICD-10-CM | POA: Diagnosis not present

## 2017-06-15 ENCOUNTER — Other Ambulatory Visit: Payer: Self-pay | Admitting: Orthopedic Surgery

## 2017-06-15 DIAGNOSIS — M25562 Pain in left knee: Secondary | ICD-10-CM

## 2017-06-17 ENCOUNTER — Ambulatory Visit
Admission: RE | Admit: 2017-06-17 | Discharge: 2017-06-17 | Disposition: A | Discharge status: Home / Self Care | Payer: PPO | Source: Ambulatory Visit | Attending: Orthopedic Surgery | Admitting: Orthopedic Surgery

## 2017-06-17 DIAGNOSIS — M25562 Pain in left knee: Secondary | ICD-10-CM

## 2017-06-17 DIAGNOSIS — M23222 Derangement of posterior horn of medial meniscus due to old tear or injury, left knee: Secondary | ICD-10-CM | POA: Diagnosis not present

## 2017-06-18 DIAGNOSIS — S83241A Other tear of medial meniscus, current injury, right knee, initial encounter: Secondary | ICD-10-CM | POA: Diagnosis not present

## 2017-07-07 DIAGNOSIS — I10 Essential (primary) hypertension: Secondary | ICD-10-CM | POA: Diagnosis not present

## 2017-07-07 DIAGNOSIS — E119 Type 2 diabetes mellitus without complications: Secondary | ICD-10-CM | POA: Diagnosis not present

## 2017-07-14 DIAGNOSIS — Z0000 Encounter for general adult medical examination without abnormal findings: Secondary | ICD-10-CM | POA: Diagnosis not present

## 2017-07-14 DIAGNOSIS — E119 Type 2 diabetes mellitus without complications: Secondary | ICD-10-CM | POA: Diagnosis not present

## 2017-07-14 DIAGNOSIS — I10 Essential (primary) hypertension: Secondary | ICD-10-CM | POA: Diagnosis not present

## 2017-07-24 DIAGNOSIS — M1711 Unilateral primary osteoarthritis, right knee: Secondary | ICD-10-CM | POA: Diagnosis not present

## 2017-07-24 DIAGNOSIS — G8929 Other chronic pain: Secondary | ICD-10-CM | POA: Diagnosis not present

## 2017-08-05 DIAGNOSIS — M94262 Chondromalacia, left knee: Secondary | ICD-10-CM | POA: Diagnosis not present

## 2017-08-05 DIAGNOSIS — M25562 Pain in left knee: Secondary | ICD-10-CM | POA: Diagnosis not present

## 2017-08-05 DIAGNOSIS — G8918 Other acute postprocedural pain: Secondary | ICD-10-CM | POA: Diagnosis not present

## 2017-08-05 DIAGNOSIS — M6752 Plica syndrome, left knee: Secondary | ICD-10-CM | POA: Diagnosis not present

## 2017-08-05 DIAGNOSIS — M659 Synovitis and tenosynovitis, unspecified: Secondary | ICD-10-CM | POA: Diagnosis not present

## 2017-08-11 DIAGNOSIS — M25662 Stiffness of left knee, not elsewhere classified: Secondary | ICD-10-CM | POA: Diagnosis not present

## 2017-08-11 DIAGNOSIS — M25562 Pain in left knee: Secondary | ICD-10-CM | POA: Diagnosis not present

## 2017-08-11 DIAGNOSIS — Z9889 Other specified postprocedural states: Secondary | ICD-10-CM | POA: Diagnosis not present

## 2017-09-15 DIAGNOSIS — M1711 Unilateral primary osteoarthritis, right knee: Secondary | ICD-10-CM | POA: Diagnosis not present

## 2017-10-14 DIAGNOSIS — R0683 Snoring: Secondary | ICD-10-CM | POA: Diagnosis not present

## 2017-10-14 DIAGNOSIS — R29818 Other symptoms and signs involving the nervous system: Secondary | ICD-10-CM | POA: Diagnosis not present

## 2017-10-14 DIAGNOSIS — M79605 Pain in left leg: Secondary | ICD-10-CM | POA: Diagnosis not present

## 2017-10-14 DIAGNOSIS — M79604 Pain in right leg: Secondary | ICD-10-CM | POA: Diagnosis not present

## 2017-10-14 DIAGNOSIS — I10 Essential (primary) hypertension: Secondary | ICD-10-CM | POA: Diagnosis not present

## 2017-11-05 ENCOUNTER — Ambulatory Visit: Payer: PPO | Attending: Neurology

## 2017-11-05 DIAGNOSIS — G4730 Sleep apnea, unspecified: Secondary | ICD-10-CM | POA: Insufficient documentation

## 2017-11-05 DIAGNOSIS — R0683 Snoring: Secondary | ICD-10-CM | POA: Diagnosis not present

## 2017-11-17 DIAGNOSIS — E119 Type 2 diabetes mellitus without complications: Secondary | ICD-10-CM | POA: Diagnosis not present

## 2017-11-17 DIAGNOSIS — R600 Localized edema: Secondary | ICD-10-CM | POA: Diagnosis not present

## 2017-11-17 DIAGNOSIS — I10 Essential (primary) hypertension: Secondary | ICD-10-CM | POA: Diagnosis not present

## 2017-12-02 ENCOUNTER — Ambulatory Visit: Payer: PPO | Attending: Neurology

## 2017-12-02 DIAGNOSIS — G4733 Obstructive sleep apnea (adult) (pediatric): Secondary | ICD-10-CM | POA: Diagnosis not present

## 2018-01-04 ENCOUNTER — Other Ambulatory Visit: Payer: Self-pay | Admitting: Otolaryngology

## 2018-01-04 DIAGNOSIS — E041 Nontoxic single thyroid nodule: Secondary | ICD-10-CM

## 2018-01-04 DIAGNOSIS — J209 Acute bronchitis, unspecified: Secondary | ICD-10-CM | POA: Diagnosis not present

## 2018-01-04 DIAGNOSIS — G4733 Obstructive sleep apnea (adult) (pediatric): Secondary | ICD-10-CM | POA: Diagnosis not present

## 2018-01-04 DIAGNOSIS — R05 Cough: Secondary | ICD-10-CM | POA: Diagnosis not present

## 2018-01-11 ENCOUNTER — Ambulatory Visit
Admission: RE | Admit: 2018-01-11 | Discharge: 2018-01-11 | Disposition: A | Discharge status: Home / Self Care | Payer: PPO | Source: Ambulatory Visit | Attending: Otolaryngology | Admitting: Otolaryngology

## 2018-01-11 DIAGNOSIS — E041 Nontoxic single thyroid nodule: Secondary | ICD-10-CM | POA: Insufficient documentation

## 2018-01-13 DIAGNOSIS — E119 Type 2 diabetes mellitus without complications: Secondary | ICD-10-CM | POA: Diagnosis not present

## 2018-01-13 DIAGNOSIS — J189 Pneumonia, unspecified organism: Secondary | ICD-10-CM | POA: Diagnosis not present

## 2018-01-13 DIAGNOSIS — R062 Wheezing: Secondary | ICD-10-CM | POA: Diagnosis not present

## 2018-01-13 DIAGNOSIS — R05 Cough: Secondary | ICD-10-CM | POA: Diagnosis not present

## 2018-01-15 DIAGNOSIS — G4733 Obstructive sleep apnea (adult) (pediatric): Secondary | ICD-10-CM | POA: Diagnosis not present

## 2018-01-27 DIAGNOSIS — R1084 Generalized abdominal pain: Secondary | ICD-10-CM | POA: Diagnosis not present

## 2018-01-27 DIAGNOSIS — R05 Cough: Secondary | ICD-10-CM | POA: Diagnosis not present

## 2018-02-11 DIAGNOSIS — L578 Other skin changes due to chronic exposure to nonionizing radiation: Secondary | ICD-10-CM | POA: Diagnosis not present

## 2018-02-11 DIAGNOSIS — D1800 Hemangioma unspecified site: Secondary | ICD-10-CM | POA: Diagnosis not present

## 2018-02-11 DIAGNOSIS — D485 Neoplasm of uncertain behavior of skin: Secondary | ICD-10-CM | POA: Diagnosis not present

## 2018-02-11 DIAGNOSIS — L820 Inflamed seborrheic keratosis: Secondary | ICD-10-CM | POA: Diagnosis not present

## 2018-02-11 DIAGNOSIS — B353 Tinea pedis: Secondary | ICD-10-CM | POA: Diagnosis not present

## 2018-02-11 DIAGNOSIS — L812 Freckles: Secondary | ICD-10-CM | POA: Diagnosis not present

## 2018-02-11 DIAGNOSIS — D225 Melanocytic nevi of trunk: Secondary | ICD-10-CM | POA: Diagnosis not present

## 2018-02-11 DIAGNOSIS — L821 Other seborrheic keratosis: Secondary | ICD-10-CM | POA: Diagnosis not present

## 2018-02-11 DIAGNOSIS — L918 Other hypertrophic disorders of the skin: Secondary | ICD-10-CM | POA: Diagnosis not present

## 2018-02-11 DIAGNOSIS — Z1283 Encounter for screening for malignant neoplasm of skin: Secondary | ICD-10-CM | POA: Diagnosis not present

## 2018-02-11 DIAGNOSIS — Z85828 Personal history of other malignant neoplasm of skin: Secondary | ICD-10-CM | POA: Diagnosis not present

## 2018-02-14 DIAGNOSIS — G4733 Obstructive sleep apnea (adult) (pediatric): Secondary | ICD-10-CM | POA: Diagnosis not present

## 2018-02-15 DIAGNOSIS — G4733 Obstructive sleep apnea (adult) (pediatric): Secondary | ICD-10-CM | POA: Diagnosis not present

## 2018-03-17 DIAGNOSIS — G4733 Obstructive sleep apnea (adult) (pediatric): Secondary | ICD-10-CM | POA: Diagnosis not present

## 2018-04-16 DIAGNOSIS — G4733 Obstructive sleep apnea (adult) (pediatric): Secondary | ICD-10-CM | POA: Diagnosis not present

## 2018-04-17 DIAGNOSIS — G4733 Obstructive sleep apnea (adult) (pediatric): Secondary | ICD-10-CM | POA: Diagnosis not present

## 2018-04-30 ENCOUNTER — Emergency Department
Admission: EM | Admit: 2018-04-30 | Discharge: 2018-04-30 | Disposition: A | Discharge status: Home / Self Care | Payer: PPO | Attending: Emergency Medicine | Admitting: Emergency Medicine

## 2018-04-30 ENCOUNTER — Other Ambulatory Visit: Payer: Self-pay

## 2018-04-30 ENCOUNTER — Encounter: Payer: Self-pay | Admitting: Emergency Medicine

## 2018-04-30 ENCOUNTER — Emergency Department: Payer: PPO

## 2018-04-30 DIAGNOSIS — I10 Essential (primary) hypertension: Secondary | ICD-10-CM | POA: Diagnosis not present

## 2018-04-30 DIAGNOSIS — R05 Cough: Secondary | ICD-10-CM | POA: Diagnosis not present

## 2018-04-30 DIAGNOSIS — M791 Myalgia, unspecified site: Secondary | ICD-10-CM | POA: Diagnosis present

## 2018-04-30 DIAGNOSIS — J101 Influenza due to other identified influenza virus with other respiratory manifestations: Secondary | ICD-10-CM | POA: Insufficient documentation

## 2018-04-30 DIAGNOSIS — Z79899 Other long term (current) drug therapy: Secondary | ICD-10-CM | POA: Insufficient documentation

## 2018-04-30 DIAGNOSIS — R110 Nausea: Secondary | ICD-10-CM | POA: Diagnosis not present

## 2018-04-30 LAB — INFLUENZA PANEL BY PCR (TYPE A & B)
Influenza A By PCR: POSITIVE — AB
Influenza B By PCR: NEGATIVE

## 2018-04-30 MED ORDER — OSELTAMIVIR PHOSPHATE 75 MG PO CAPS: 75.0000 mg | ORAL_CAPSULE | Freq: Two times a day (BID) | ORAL | 0 refills | Status: AC

## 2018-04-30 MED ORDER — PROMETHAZINE-PHENYLEPHRINE 6.25-5 MG/5ML PO SYRP: 5.0000 mL | ORAL_SOLUTION | ORAL | 0 refills | Status: DC | PRN

## 2018-04-30 NOTE — ED Provider Notes (Signed)
[facility] Emergency Department Provider Note   ____________________________________________   First MD Initiated Contact with Patient 04/30/18 [phone]     (approximate)  I have reviewed the triage vital signs and the nursing notes.   HISTORY  Chief Complaint Cough; Fever; and Generalized Body Aches    HPI Cheryl Key is a 73 y.o. female patient presents with body aches, fever, headache, and cough for the past 2 days.  Patient state nausea but no vomiting or diarrhea.  Patient the cough is productive increased with deep inspirations.  Patient is not taken flu shot for this season.  Patient rates her pain discomfort a 6/10.  Patient describes her pain is "achy".  No palliative measure for complaint.    Past Medical History:  Diagnosis Date  . Cancer ([place])    basal cell skin cancer  . Carpal tunnel syndrome   . Gastritis   . GERD (gastroesophageal reflux disease)   . History of hepatitis A   . Migraines   . Other and unspecified angina pectoris   . Unspecified essential hypertension     Patient Active Problem List   Diagnosis Date Noted  . Chest pain 05/11/2012  . Unspecified essential hypertension     Past Surgical History:  Procedure Laterality Date  . basel cell removal    . colonoscopy    . COLONOSCOPY WITH PROPOFOL N/A 03/30/2017   Procedure: COLONOSCOPY WITH PROPOFOL;  Surgeon: Manya Silvas, MD;  Location: [facility] ENDOSCOPY;  Service: Endoscopy;  Laterality: N/A;  . ESOPHAGOGASTRODUODENOSCOPY (EGD) WITH PROPOFOL N/A 03/30/2017   Procedure: ESOPHAGOGASTRODUODENOSCOPY (EGD) WITH PROPOFOL;  Surgeon: Manya Silvas, MD;  Location[facility] ENDOSCOPY;  Service: Endoscopy;  Laterality: N/A;  . FOOT SURGERY     x2  . GALLBLADDER SURGERY    . ORIF SHOULDER FRACTURE    . UPPER GI ENDOSCOPY    . VAGINAL HYSTERECTOMY  1986   endometriosis    Prior to Admission medications   Medication Sig Start Date End Date Taking? Authorizing Provider    calcium-vitamin D (OSCAL WITH D) 500-200 MG-UNIT tablet Take 1 tablet by mouth 2 (two) times daily.    [provider]  fluticasone (FLONASE) 50 MCG/ACT nasal spray Place 2 sprays into both nostrils daily.    [provider]  glucosamine-chondroitin 500-400 MG tablet Take 1 tablet by mouth 3 (three) times daily.    [provider]  losartan-hydrochlorothiazide (HYZAAR) 50-12.5 MG tablet Take 1 tablet by mouth daily.    [provider]  metoprolol succinate (TOPROL-XL) 50 MG 24 hr tablet Take 50 mg by mouth daily. Take with or immediately following a meal.    [provider]  Multiple Vitamin (MULTIVITAMIN) tablet Take 1 tablet by mouth daily.    [provider]  oseltamivir (TAMIFLU) 75 MG capsule Take 1 capsule (75 mg total) by mouth 2 (two) times daily for 5 days. 04/30/18 05/05/18  Sable Feil, PA-C  promethazine-phenylephrine (PROMETHAZINE VC) 6.25-5 MG/5ML SYRP Take 5 mLs by mouth every 4 (four) hours as needed for congestion. 04/30/18   Sable Feil, PA-C    Allergies Aleve [naproxen sodium]; Augmentin [amoxicillin-pot clavulanate]; Cefuroxime; and Naprosyn [naproxen]  Family History  Problem Relation Age of Onset  . Hypertension Mother   . Arrhythmia Brother   . Hyperlipidemia Brother   . Hypertension Brother   . Breast cancer Neg Hx     Social History Social History   Tobacco Use  . Smoking status: Never  Smoker  . Smokeless tobacco: Never Used  Substance Use Topics  . Alcohol use: No  . Drug use: No    Review of Systems Constitutional: Fever and body aches.   Eyes: No visual changes. ENT: No sore throat.  Nasal congestion. Cardiovascular: Denies chest pain. Respiratory: Denies shortness of breath.  Ducted cough. Gastrointestinal: No abdominal pain.  Nausea, no vomiting.  No diarrhea.  No constipation. Genitourinary: Negative for dysuria. Musculoskeletal: Negative for back pain. Skin: Negative for  rash. Neurological: Positive for headaches, but denies focal weakness or numbness. Endocrine:  Hypertension Allergic/Immunilogical: See allergy list. ____________________________________________   PHYSICAL EXAM:  VITAL SIGNS: ED Triage Vitals  Enc Vitals Group     BP 04/30/18 0815 118/70     Pulse Rate 04/30/18 0815 88     Resp 04/30/18 0815 16     Temp 04/30/18 0815 98.7 F (37.1 C)     Temp Source 04/30/18 0815 Oral     SpO2 04/30/18 0815 97 %     Weight 04/30/18 0812 240 lb (108.9 kg)     Height 04/30/18 0812 5\' 4"  (1.626 m)     Head Circumference --      Peak Flow --      Pain Score 04/30/18 0811 6     Pain Loc --      Pain Edu? --      Excl. in [place]? --     Constitutional: Alert and oriented. Well appearing and in no acute distress. Nose: Edematous nasal turbinates. Mouth/Throat: Mucous membranes are moist.  Oropharynx non-erythematous.  Postnasal drainage. Neck: No stridor.  Hematological/Lymphatic/Immunilogical: No cervical lymphadenopathy. Cardiovascular: Normal rate, regular rhythm. Grossly normal heart sounds.  Good peripheral circulation. Respiratory: Normal respiratory effort.  No retractions. Lungs CTAB. Gastrointestinal: Soft and nontender. No distention. No abdominal bruits. No CVA tenderness. Skin:  Skin is warm, dry and intact. No rash noted. Psychiatric: Mood and affect are normal. Speech and behavior are normal.  ____________________________________________   LABS (all labs ordered are listed, but only abnormal results are displayed)  Labs Reviewed  INFLUENZA PANEL BY PCR (TYPE A & B) - Abnormal; Notable for the following components:      Result Value   Influenza A By PCR POSITIVE (*)    All other components within normal limits   ____________________________________________  EKG  ____________________________________________  RADIOLOGY  ED MD interpretation:    Official radiology report(s): Dg Chest 2 View  Result Date:  04/30/2018 CLINICAL DATA:  Fever and productive cough EXAM: CHEST - 2 VIEW COMPARISON:  Chest CT 06/04/2012 FINDINGS: Normal heart size and mediastinal contours. There is increased density at the medial right base on the frontal view but no pneumonia based on the lateral view. No pulmonary edema. No effusion or pneumothorax. No acute osseous findings. IMPRESSION: Negative chest. Electronically Signed   By: Monte Fantasia M.D.   On: 04/30/2018 08:59    ____________________________________________   PROCEDURES  Procedure(s) performed: None  Procedures  Critical Care performed: No  ____________________________________________   INITIAL IMPRESSION / ASSESSMENT AND PLAN / ED COURSE  As part of my medical decision making, I reviewed the following data within the [place]     Patient presents with headache fever and cough for 2 days.  Patient test positive influenza A.  Patient given discharge care instruction advised take medication as directed.  Follow-up with PCP.      ____________________________________________   FINAL CLINICAL IMPRESSION(S) / ED DIAGNOSES  Final diagnoses:  Influenza  A     ED Discharge Orders         Ordered    oseltamivir (TAMIFLU) 75 MG capsule  2 times daily     04/30/18 1006    promethazine-phenylephrine (PROMETHAZINE VC) 6.25-5 MG/5ML SYRP  Every 4 hours PRN     04/30/18 1006           Note:  This document was prepared using Dragon voice recognition software and may include unintentional dictation errors.    Sable Feil, PA-C 04/30/18 1008    Delman Kitten, MD 04/30/18 [phone]

## 2018-04-30 NOTE — ED Notes (Signed)
See triage note  Presents with generalized body aches fever and prod cough  States sx's started on weds  Fever worse at night  Afebrile on arrival this am

## 2018-04-30 NOTE — ED Triage Notes (Signed)
Body aches, fever, and cough for the past few days. NAD.

## 2018-05-16 DIAGNOSIS — G4733 Obstructive sleep apnea (adult) (pediatric): Secondary | ICD-10-CM | POA: Diagnosis not present

## 2018-06-16 DIAGNOSIS — G4733 Obstructive sleep apnea (adult) (pediatric): Secondary | ICD-10-CM | POA: Diagnosis not present

## 2018-07-08 DIAGNOSIS — E119 Type 2 diabetes mellitus without complications: Secondary | ICD-10-CM | POA: Diagnosis not present

## 2018-07-15 DIAGNOSIS — K5900 Constipation, unspecified: Secondary | ICD-10-CM | POA: Diagnosis not present

## 2018-07-15 DIAGNOSIS — R609 Edema, unspecified: Secondary | ICD-10-CM | POA: Diagnosis not present

## 2018-07-15 DIAGNOSIS — E119 Type 2 diabetes mellitus without complications: Secondary | ICD-10-CM | POA: Diagnosis not present

## 2018-07-15 DIAGNOSIS — I10 Essential (primary) hypertension: Secondary | ICD-10-CM | POA: Diagnosis not present

## 2018-07-15 DIAGNOSIS — Z0000 Encounter for general adult medical examination without abnormal findings: Secondary | ICD-10-CM | POA: Diagnosis not present

## 2018-07-16 DIAGNOSIS — G4733 Obstructive sleep apnea (adult) (pediatric): Secondary | ICD-10-CM | POA: Diagnosis not present

## 2018-08-16 DIAGNOSIS — G4733 Obstructive sleep apnea (adult) (pediatric): Secondary | ICD-10-CM | POA: Diagnosis not present

## 2018-08-18 DIAGNOSIS — R200 Anesthesia of skin: Secondary | ICD-10-CM | POA: Diagnosis not present

## 2018-08-18 DIAGNOSIS — M7062 Trochanteric bursitis, left hip: Secondary | ICD-10-CM | POA: Diagnosis not present

## 2018-09-15 DIAGNOSIS — G4733 Obstructive sleep apnea (adult) (pediatric): Secondary | ICD-10-CM | POA: Diagnosis not present

## 2018-09-28 ENCOUNTER — Other Ambulatory Visit: Payer: Self-pay | Admitting: Family Medicine

## 2018-09-28 DIAGNOSIS — Z1231 Encounter for screening mammogram for malignant neoplasm of breast: Secondary | ICD-10-CM

## 2018-10-14 DIAGNOSIS — L570 Actinic keratosis: Secondary | ICD-10-CM | POA: Diagnosis not present

## 2018-10-14 DIAGNOSIS — L821 Other seborrheic keratosis: Secondary | ICD-10-CM | POA: Diagnosis not present

## 2018-10-14 DIAGNOSIS — L820 Inflamed seborrheic keratosis: Secondary | ICD-10-CM | POA: Diagnosis not present

## 2018-10-16 DIAGNOSIS — G4733 Obstructive sleep apnea (adult) (pediatric): Secondary | ICD-10-CM | POA: Diagnosis not present

## 2018-10-18 DIAGNOSIS — G4733 Obstructive sleep apnea (adult) (pediatric): Secondary | ICD-10-CM | POA: Diagnosis not present

## 2018-11-16 ENCOUNTER — Ambulatory Visit
Admission: RE | Admit: 2018-11-16 | Discharge: 2018-11-16 | Disposition: A | Discharge status: Home / Self Care | Payer: PPO | Source: Ambulatory Visit | Attending: Family Medicine | Admitting: Family Medicine

## 2018-11-16 DIAGNOSIS — Z1231 Encounter for screening mammogram for malignant neoplasm of breast: Secondary | ICD-10-CM | POA: Diagnosis not present

## 2018-11-16 DIAGNOSIS — M25552 Pain in left hip: Secondary | ICD-10-CM | POA: Diagnosis not present

## 2018-12-07 DIAGNOSIS — G4733 Obstructive sleep apnea (adult) (pediatric): Secondary | ICD-10-CM | POA: Diagnosis not present

## 2018-12-09 DIAGNOSIS — M7062 Trochanteric bursitis, left hip: Secondary | ICD-10-CM | POA: Diagnosis not present

## 2018-12-09 DIAGNOSIS — M25552 Pain in left hip: Secondary | ICD-10-CM | POA: Diagnosis not present

## 2018-12-13 DIAGNOSIS — M7062 Trochanteric bursitis, left hip: Secondary | ICD-10-CM | POA: Diagnosis not present

## 2018-12-16 DIAGNOSIS — E785 Hyperlipidemia, unspecified: Secondary | ICD-10-CM | POA: Diagnosis not present

## 2018-12-16 DIAGNOSIS — E119 Type 2 diabetes mellitus without complications: Secondary | ICD-10-CM | POA: Diagnosis not present

## 2018-12-16 DIAGNOSIS — M7062 Trochanteric bursitis, left hip: Secondary | ICD-10-CM | POA: Diagnosis not present

## 2018-12-21 DIAGNOSIS — M7062 Trochanteric bursitis, left hip: Secondary | ICD-10-CM | POA: Diagnosis not present

## 2018-12-23 DIAGNOSIS — I10 Essential (primary) hypertension: Secondary | ICD-10-CM | POA: Diagnosis not present

## 2018-12-23 DIAGNOSIS — E119 Type 2 diabetes mellitus without complications: Secondary | ICD-10-CM | POA: Diagnosis not present

## 2018-12-23 DIAGNOSIS — Z0000 Encounter for general adult medical examination without abnormal findings: Secondary | ICD-10-CM | POA: Diagnosis not present

## 2018-12-23 DIAGNOSIS — R600 Localized edema: Secondary | ICD-10-CM | POA: Diagnosis not present

## 2018-12-24 DIAGNOSIS — M7062 Trochanteric bursitis, left hip: Secondary | ICD-10-CM | POA: Diagnosis not present

## 2018-12-28 DIAGNOSIS — M7062 Trochanteric bursitis, left hip: Secondary | ICD-10-CM | POA: Diagnosis not present

## 2018-12-31 DIAGNOSIS — M7062 Trochanteric bursitis, left hip: Secondary | ICD-10-CM | POA: Diagnosis not present

## 2019-01-04 DIAGNOSIS — M7062 Trochanteric bursitis, left hip: Secondary | ICD-10-CM | POA: Diagnosis not present

## 2019-01-05 DIAGNOSIS — E041 Nontoxic single thyroid nodule: Secondary | ICD-10-CM | POA: Diagnosis not present

## 2019-01-06 DIAGNOSIS — G4733 Obstructive sleep apnea (adult) (pediatric): Secondary | ICD-10-CM | POA: Diagnosis not present

## 2019-01-07 DIAGNOSIS — M7062 Trochanteric bursitis, left hip: Secondary | ICD-10-CM | POA: Diagnosis not present

## 2019-01-11 DIAGNOSIS — G4733 Obstructive sleep apnea (adult) (pediatric): Secondary | ICD-10-CM | POA: Diagnosis not present

## 2019-01-11 DIAGNOSIS — E041 Nontoxic single thyroid nodule: Secondary | ICD-10-CM | POA: Diagnosis not present

## 2019-01-21 DIAGNOSIS — G4733 Obstructive sleep apnea (adult) (pediatric): Secondary | ICD-10-CM | POA: Diagnosis not present

## 2019-02-06 DIAGNOSIS — G4733 Obstructive sleep apnea (adult) (pediatric): Secondary | ICD-10-CM | POA: Diagnosis not present

## 2019-02-16 DIAGNOSIS — D224 Melanocytic nevi of scalp and neck: Secondary | ICD-10-CM | POA: Diagnosis not present

## 2019-02-16 DIAGNOSIS — Z85828 Personal history of other malignant neoplasm of skin: Secondary | ICD-10-CM

## 2019-02-16 DIAGNOSIS — D235 Other benign neoplasm of skin of trunk: Secondary | ICD-10-CM | POA: Diagnosis not present

## 2019-02-16 DIAGNOSIS — D229 Melanocytic nevi, unspecified: Secondary | ICD-10-CM | POA: Diagnosis not present

## 2019-02-16 DIAGNOSIS — D1801 Hemangioma of skin and subcutaneous tissue: Secondary | ICD-10-CM | POA: Diagnosis not present

## 2019-02-16 DIAGNOSIS — L821 Other seborrheic keratosis: Secondary | ICD-10-CM | POA: Diagnosis not present

## 2019-02-16 DIAGNOSIS — L918 Other hypertrophic disorders of the skin: Secondary | ICD-10-CM | POA: Diagnosis not present

## 2019-02-16 DIAGNOSIS — H00012 Hordeolum externum right lower eyelid: Secondary | ICD-10-CM | POA: Diagnosis not present

## 2019-02-16 DIAGNOSIS — D485 Neoplasm of uncertain behavior of skin: Secondary | ICD-10-CM | POA: Diagnosis not present

## 2019-02-16 DIAGNOSIS — C44619 Basal cell carcinoma of skin of left upper limb, including shoulder: Secondary | ICD-10-CM | POA: Diagnosis not present

## 2019-02-16 DIAGNOSIS — B353 Tinea pedis: Secondary | ICD-10-CM | POA: Diagnosis not present

## 2019-02-16 DIAGNOSIS — Z1283 Encounter for screening for malignant neoplasm of skin: Secondary | ICD-10-CM | POA: Diagnosis not present

## 2019-02-16 DIAGNOSIS — D692 Other nonthrombocytopenic purpura: Secondary | ICD-10-CM | POA: Diagnosis not present

## 2019-02-16 HISTORY — DX: Personal history of other malignant neoplasm of skin: Z85.828

## 2019-02-21 DIAGNOSIS — D229 Melanocytic nevi, unspecified: Secondary | ICD-10-CM

## 2019-02-21 HISTORY — DX: Melanocytic nevi, unspecified: D22.9

## 2019-03-08 DIAGNOSIS — G4733 Obstructive sleep apnea (adult) (pediatric): Secondary | ICD-10-CM | POA: Diagnosis not present

## 2019-04-04 DIAGNOSIS — H2513 Age-related nuclear cataract, bilateral: Secondary | ICD-10-CM | POA: Diagnosis not present

## 2019-04-08 DIAGNOSIS — G4733 Obstructive sleep apnea (adult) (pediatric): Secondary | ICD-10-CM | POA: Diagnosis not present

## 2019-04-12 DIAGNOSIS — C44519 Basal cell carcinoma of skin of other part of trunk: Secondary | ICD-10-CM | POA: Diagnosis not present

## 2019-04-12 DIAGNOSIS — L988 Other specified disorders of the skin and subcutaneous tissue: Secondary | ICD-10-CM | POA: Diagnosis not present

## 2019-04-19 DIAGNOSIS — Z4802 Encounter for removal of sutures: Secondary | ICD-10-CM | POA: Diagnosis not present

## 2019-04-22 DIAGNOSIS — G4733 Obstructive sleep apnea (adult) (pediatric): Secondary | ICD-10-CM | POA: Diagnosis not present

## 2019-05-07 DIAGNOSIS — Z23 Encounter for immunization: Secondary | ICD-10-CM | POA: Diagnosis not present

## 2019-05-08 DIAGNOSIS — G4733 Obstructive sleep apnea (adult) (pediatric): Secondary | ICD-10-CM | POA: Diagnosis not present

## 2019-06-16 DIAGNOSIS — E119 Type 2 diabetes mellitus without complications: Secondary | ICD-10-CM | POA: Diagnosis not present

## 2019-06-23 DIAGNOSIS — R600 Localized edema: Secondary | ICD-10-CM | POA: Diagnosis not present

## 2019-06-23 DIAGNOSIS — I10 Essential (primary) hypertension: Secondary | ICD-10-CM | POA: Diagnosis not present

## 2019-06-23 DIAGNOSIS — E1165 Type 2 diabetes mellitus with hyperglycemia: Secondary | ICD-10-CM | POA: Diagnosis not present

## 2019-07-21 DIAGNOSIS — G4733 Obstructive sleep apnea (adult) (pediatric): Secondary | ICD-10-CM | POA: Diagnosis not present

## 2019-09-04 DIAGNOSIS — G4733 Obstructive sleep apnea (adult) (pediatric): Secondary | ICD-10-CM | POA: Diagnosis not present

## 2019-09-10 DIAGNOSIS — S61012A Laceration without foreign body of left thumb without damage to nail, initial encounter: Secondary | ICD-10-CM | POA: Diagnosis not present

## 2019-09-10 DIAGNOSIS — W260XXA Contact with knife, initial encounter: Secondary | ICD-10-CM | POA: Diagnosis not present

## 2019-09-15 DIAGNOSIS — E1165 Type 2 diabetes mellitus with hyperglycemia: Secondary | ICD-10-CM | POA: Diagnosis not present

## 2019-09-22 DIAGNOSIS — I10 Essential (primary) hypertension: Secondary | ICD-10-CM | POA: Diagnosis not present

## 2019-09-22 DIAGNOSIS — Z4802 Encounter for removal of sutures: Secondary | ICD-10-CM | POA: Diagnosis not present

## 2019-09-22 DIAGNOSIS — E119 Type 2 diabetes mellitus without complications: Secondary | ICD-10-CM | POA: Diagnosis not present

## 2019-10-19 DIAGNOSIS — G4733 Obstructive sleep apnea (adult) (pediatric): Secondary | ICD-10-CM | POA: Diagnosis not present

## 2019-10-19 DIAGNOSIS — M1711 Unilateral primary osteoarthritis, right knee: Secondary | ICD-10-CM | POA: Diagnosis not present

## 2019-11-01 IMAGING — MG MM DIGITAL SCREENING BILAT W/ TOMO W/ CAD
6 of 10 series · 6 of 30 positions shown · non-contrast
Comparison: Previous exam(s).

CLINICAL DATA: Screening.

EXAM:
DIGITAL SCREENING BILATERAL MAMMOGRAM WITH TOMO AND CAD

[L CC synth-2D]
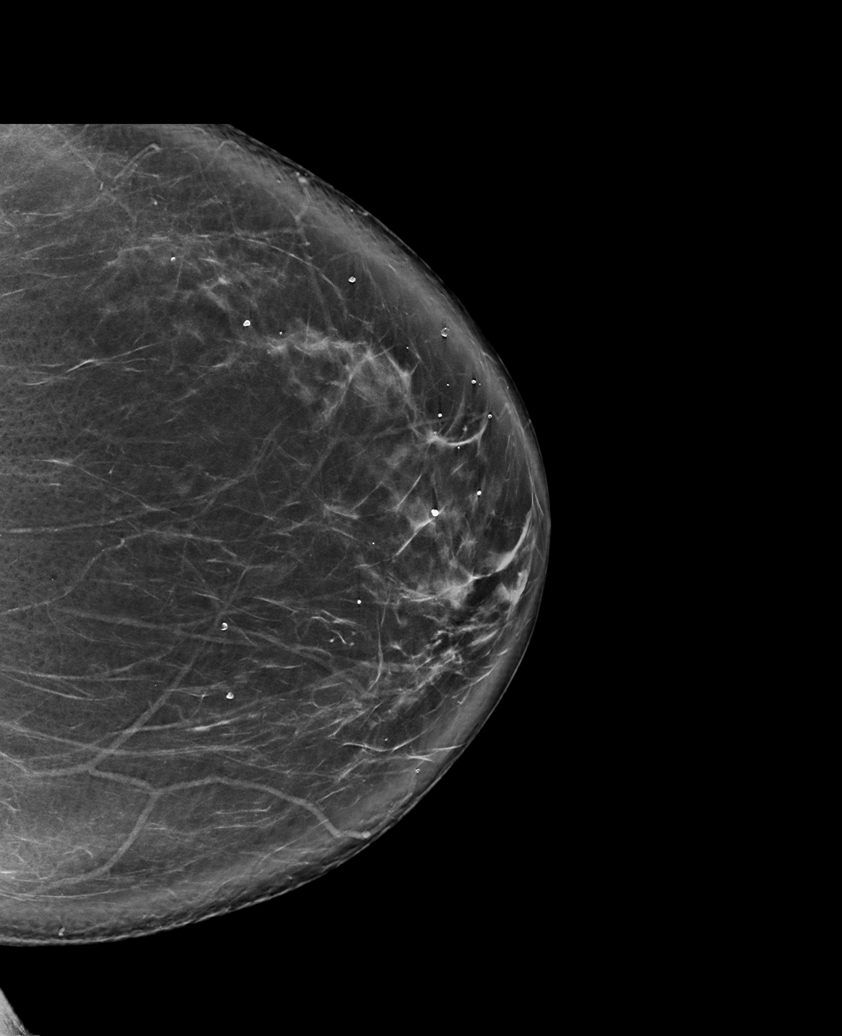

[L MLO synth-2D (1 of 2)]
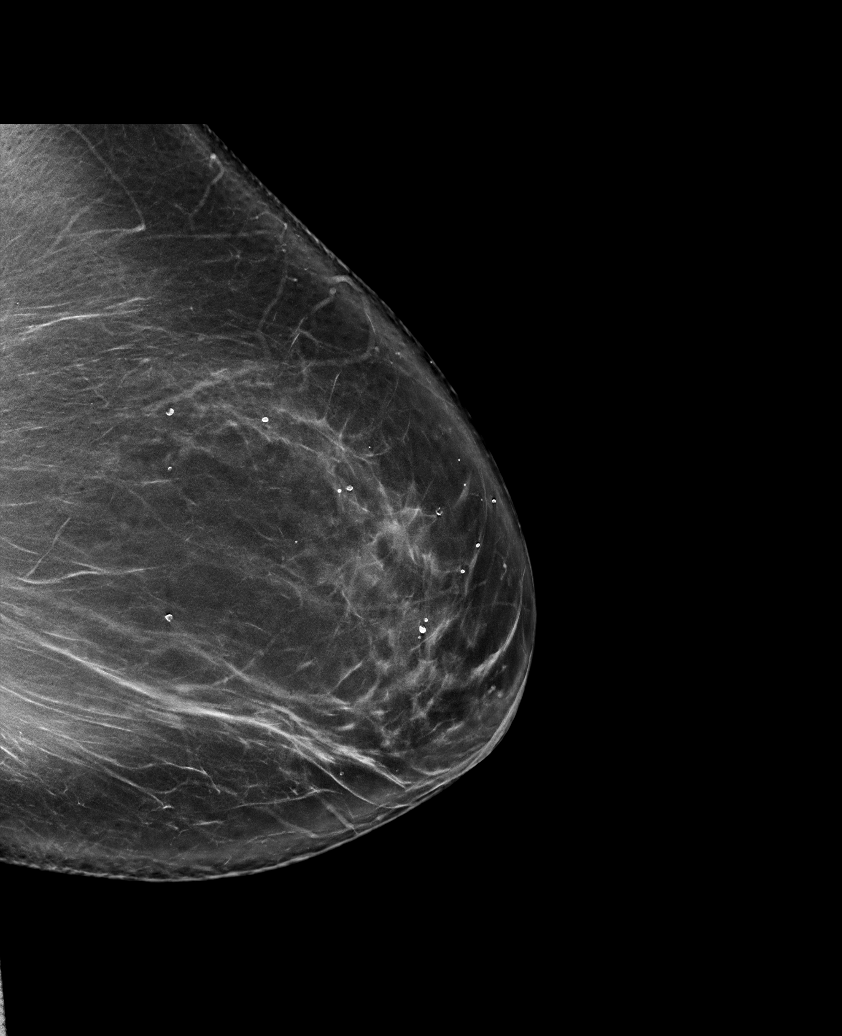

[L MLO synth-2D (2 of 2)]
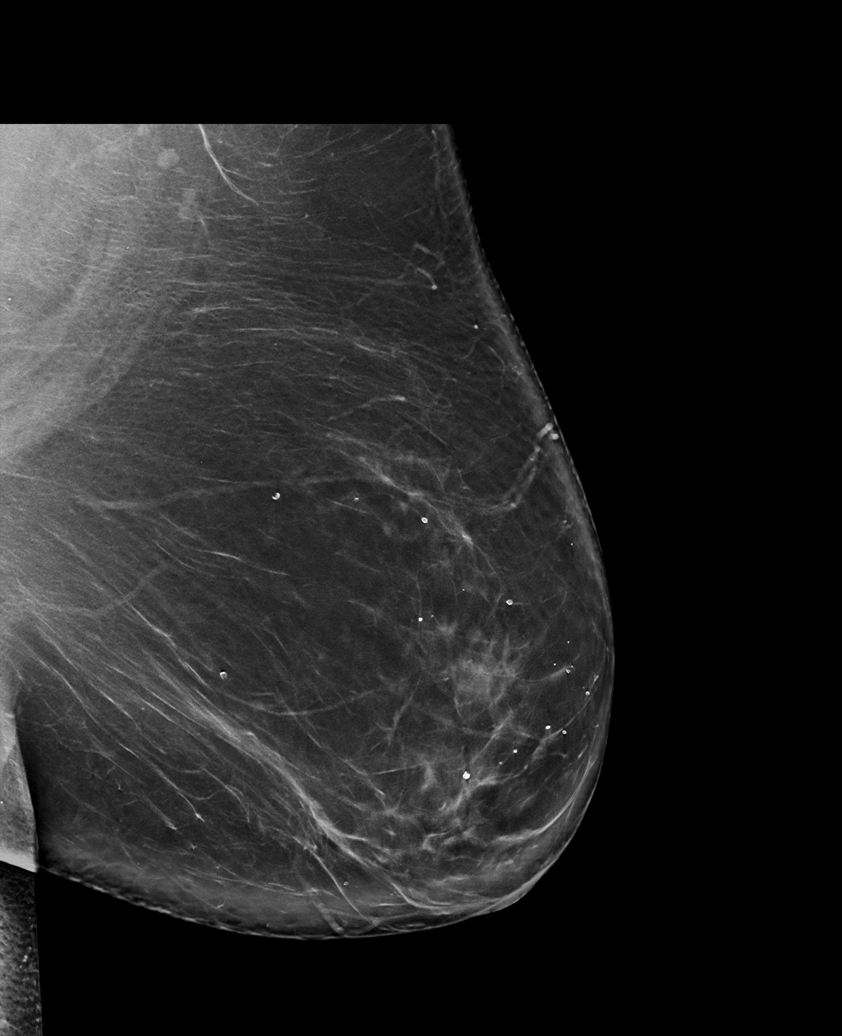

[R CC synth-2D]
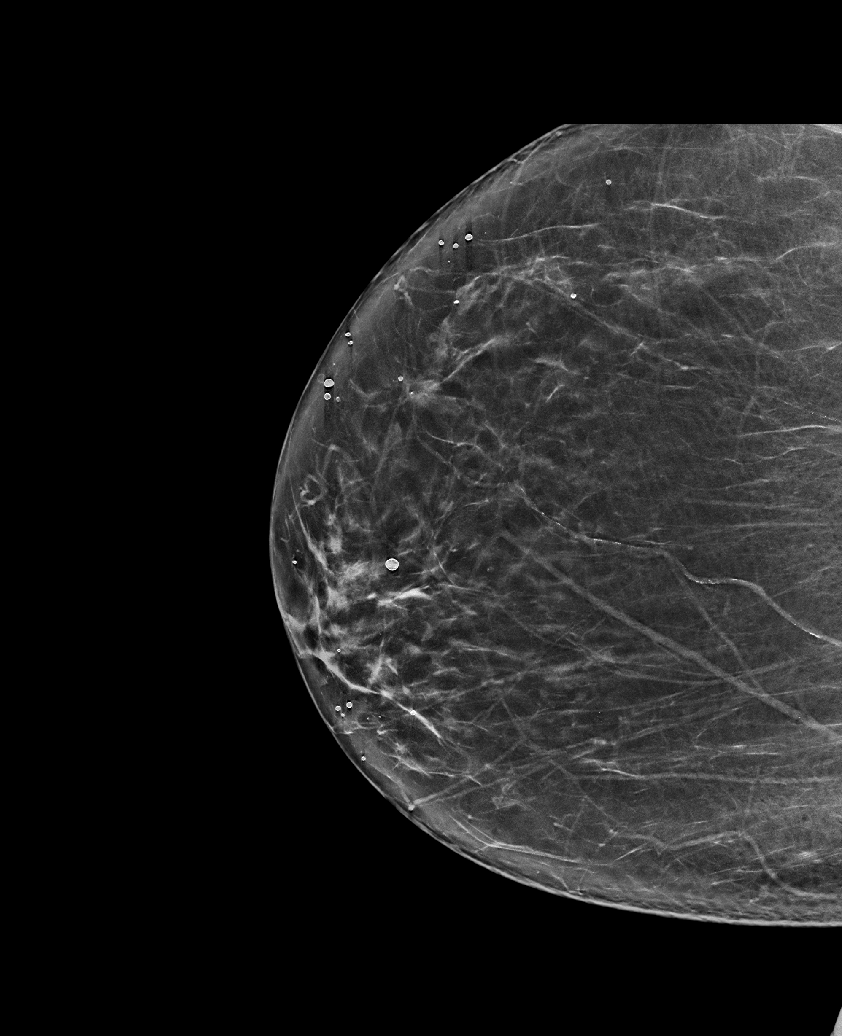

[R MLO synth-2D]
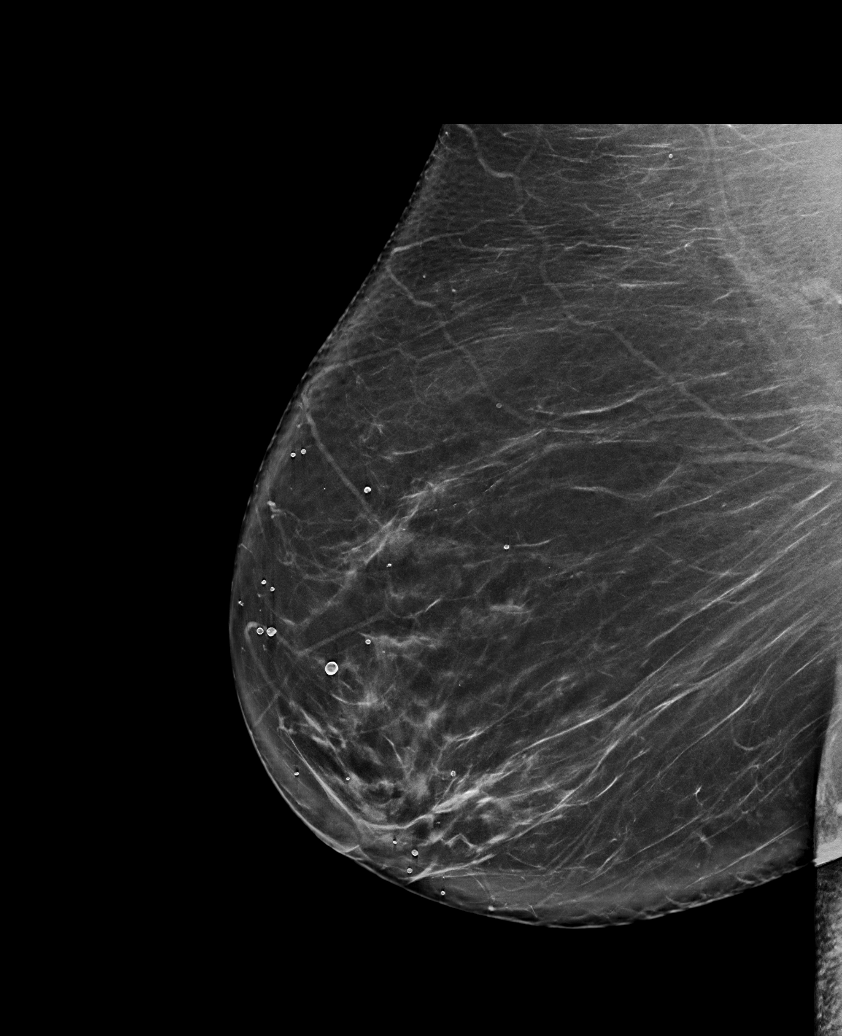

[R CC tomo · tomo slice 41/80.0]
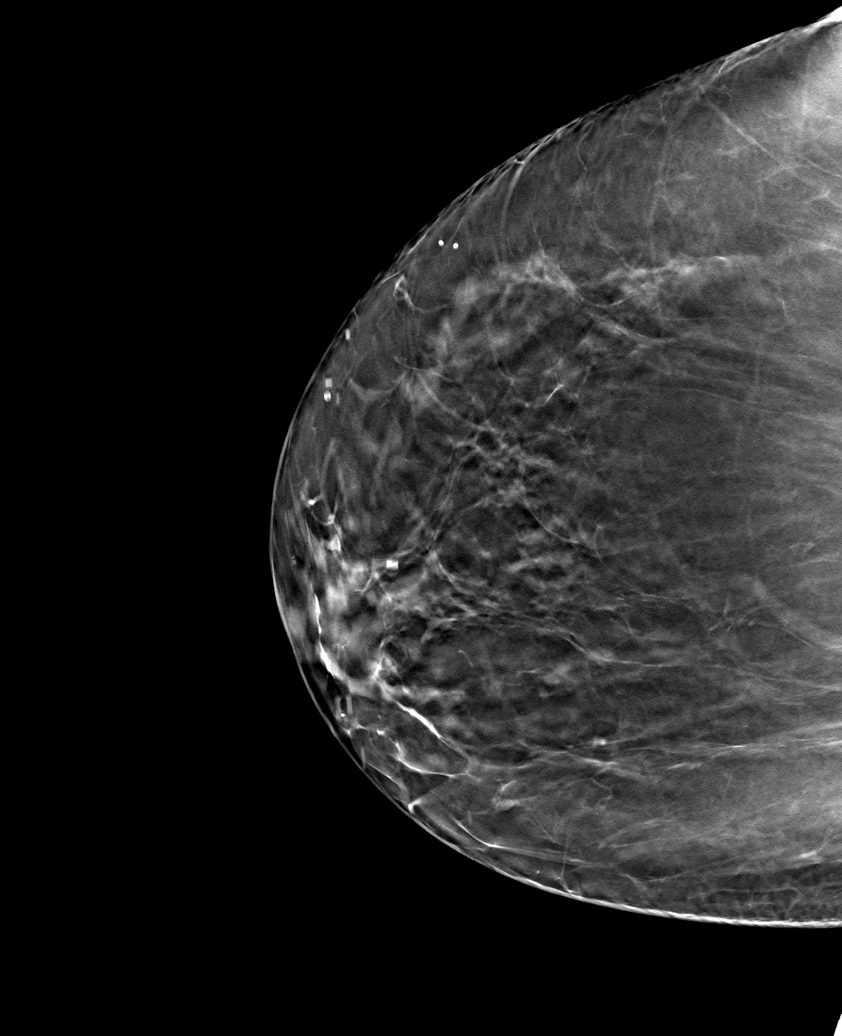

[6 of 30 positions shown; findings below may reference images not displayed]

ACR Breast Density Category b: There are scattered areas of
fibroglandular density.
FINDINGS: There are no findings suspicious for malignancy. Images were
processed with CAD.
IMPRESSION: No mammographic evidence of malignancy. A result letter of this
screening mammogram will be mailed directly to the patient.

RECOMMENDATION:
Screening mammogram in one year. (Code:[id number])

BI-RADS CATEGORY  1: Negative.

## 2020-01-09 ENCOUNTER — Other Ambulatory Visit: Payer: Self-pay | Admitting: Family Medicine

## 2020-01-09 DIAGNOSIS — Z1231 Encounter for screening mammogram for malignant neoplasm of breast: Secondary | ICD-10-CM

## 2020-01-11 DIAGNOSIS — E041 Nontoxic single thyroid nodule: Secondary | ICD-10-CM | POA: Diagnosis not present

## 2020-01-12 ENCOUNTER — Other Ambulatory Visit: Payer: Self-pay | Admitting: Otolaryngology

## 2020-01-12 DIAGNOSIS — E041 Nontoxic single thyroid nodule: Secondary | ICD-10-CM

## 2020-01-12 DIAGNOSIS — E119 Type 2 diabetes mellitus without complications: Secondary | ICD-10-CM | POA: Diagnosis not present

## 2020-01-12 DIAGNOSIS — I10 Essential (primary) hypertension: Secondary | ICD-10-CM | POA: Diagnosis not present

## 2020-01-20 DIAGNOSIS — G4733 Obstructive sleep apnea (adult) (pediatric): Secondary | ICD-10-CM | POA: Diagnosis not present

## 2020-01-24 DIAGNOSIS — Z0000 Encounter for general adult medical examination without abnormal findings: Secondary | ICD-10-CM | POA: Diagnosis not present

## 2020-01-24 DIAGNOSIS — D649 Anemia, unspecified: Secondary | ICD-10-CM | POA: Diagnosis not present

## 2020-01-24 DIAGNOSIS — I10 Essential (primary) hypertension: Secondary | ICD-10-CM | POA: Diagnosis not present

## 2020-01-24 DIAGNOSIS — E119 Type 2 diabetes mellitus without complications: Secondary | ICD-10-CM | POA: Diagnosis not present

## 2020-01-24 DIAGNOSIS — E785 Hyperlipidemia, unspecified: Secondary | ICD-10-CM | POA: Diagnosis not present

## 2020-02-14 ENCOUNTER — Other Ambulatory Visit: Payer: Self-pay

## 2020-02-14 ENCOUNTER — Ambulatory Visit
Admission: RE | Admit: 2020-02-14 | Discharge: 2020-02-14 | Disposition: A | Discharge status: Home / Self Care | Payer: PPO | Source: Ambulatory Visit | Attending: Family Medicine | Admitting: Family Medicine

## 2020-02-14 DIAGNOSIS — Z1231 Encounter for screening mammogram for malignant neoplasm of breast: Secondary | ICD-10-CM | POA: Insufficient documentation

## 2020-02-22 ENCOUNTER — Other Ambulatory Visit: Payer: Self-pay | Admitting: Family Medicine

## 2020-02-22 ENCOUNTER — Ambulatory Visit: Payer: PPO | Admitting: Dermatology

## 2020-02-22 ENCOUNTER — Other Ambulatory Visit: Payer: Self-pay

## 2020-02-22 DIAGNOSIS — D229 Melanocytic nevi, unspecified: Secondary | ICD-10-CM

## 2020-02-22 DIAGNOSIS — L821 Other seborrheic keratosis: Secondary | ICD-10-CM | POA: Diagnosis not present

## 2020-02-22 DIAGNOSIS — Z1283 Encounter for screening for malignant neoplasm of skin: Secondary | ICD-10-CM | POA: Diagnosis not present

## 2020-02-22 DIAGNOSIS — B353 Tinea pedis: Secondary | ICD-10-CM | POA: Diagnosis not present

## 2020-02-22 DIAGNOSIS — N6489 Other specified disorders of breast: Secondary | ICD-10-CM

## 2020-02-22 DIAGNOSIS — Z85828 Personal history of other malignant neoplasm of skin: Secondary | ICD-10-CM

## 2020-02-22 DIAGNOSIS — D485 Neoplasm of uncertain behavior of skin: Secondary | ICD-10-CM

## 2020-02-22 DIAGNOSIS — C44712 Basal cell carcinoma of skin of right lower limb, including hip: Secondary | ICD-10-CM | POA: Diagnosis not present

## 2020-02-22 DIAGNOSIS — D692 Other nonthrombocytopenic purpura: Secondary | ICD-10-CM

## 2020-02-22 DIAGNOSIS — Z86018 Personal history of other benign neoplasm: Secondary | ICD-10-CM

## 2020-02-22 DIAGNOSIS — L820 Inflamed seborrheic keratosis: Secondary | ICD-10-CM

## 2020-02-22 DIAGNOSIS — L814 Other melanin hyperpigmentation: Secondary | ICD-10-CM | POA: Diagnosis not present

## 2020-02-22 DIAGNOSIS — L570 Actinic keratosis: Secondary | ICD-10-CM | POA: Diagnosis not present

## 2020-02-22 DIAGNOSIS — C4491 Basal cell carcinoma of skin, unspecified: Secondary | ICD-10-CM

## 2020-02-22 DIAGNOSIS — R928 Other abnormal and inconclusive findings on diagnostic imaging of breast: Secondary | ICD-10-CM

## 2020-02-22 DIAGNOSIS — D1800 Hemangioma unspecified site: Secondary | ICD-10-CM | POA: Diagnosis not present

## 2020-02-22 HISTORY — DX: Basal cell carcinoma of skin, unspecified: C44.91

## 2020-02-22 MED ORDER — KETOCONAZOLE 2 % EX CREA: 1.0000 "application " | TOPICAL_CREAM | Freq: Every day | CUTANEOUS | 3 refills | Status: AC

## 2020-02-22 NOTE — Patient Instructions (Signed)

## 2020-02-22 NOTE — Progress Notes (Signed)
Follow-Up Visit   Subjective  Cheryl Key is a 74 y.o. female who presents for the following: Annual Exam (History of dysplastic nevus and BCC). The patient presents for Total-Body Skin Exam (TBSE) for skin cancer screening and mole check.  The following portions of the chart were reviewed this encounter and updated as appropriate:   Tobacco  Allergies  Meds  Problems  Med Hx  Surg Hx  Fam Hx     Review of Systems:  No other skin or systemic complaints except as noted in HPI or Assessment and Plan.  Objective  Well appearing patient in no apparent distress; mood and affect are within normal limits.  A full examination was performed including scalp, head, eyes, ears, nose, lips, neck, chest, axillae, abdomen, back, buttocks, bilateral upper extremities, bilateral lower extremities, hands, feet, fingers, toes, fingernails, and toenails. All findings within normal limits unless otherwise noted below.  Objective  Left medial cheek x 1, left forearm x 1 (2): Erythematous thin papules/macules with gritty scale.   Objective  Face x 10, right forearm x 1, neck/chest x 9 (20): Erythematous keratotic or waxy stuck-on papule or plaque.   Objective  bilateral feet: Scaliness  Objective  Right Lower Leg - Anterior: 1.5 cm pink patch   Assessment & Plan    Purpura - Chronic; persistent and recurrent.  Treatable, but not curable. - Violaceous macules and patches - Benign - Related to age, sun damage and/or use of blood thinners - Observe - Can use OTC arnica containing moisturizer such as Dermend Bruise Formula if desired - Call for worsening or other concerns  History of Dysplastic Nevi - No evidence of recurrence today - Recommend regular full body skin exams - Recommend daily broad spectrum sunscreen SPF 30+ to sun-exposed areas, reapply every 2 hours as needed.  - Call if any new or changing lesions are noted between office visits  History of Basal Cell Carcinoma  of the Skin - No evidence of recurrence today - Recommend regular full body skin exams - Recommend daily broad spectrum sunscreen SPF 30+ to sun-exposed areas, reapply every 2 hours as needed.  - Call if any new or changing lesions are noted between office visits  Lentigines - Scattered tan macules - Discussed due to sun exposure - Benign, observe - Call for any changes  Seborrheic Keratoses - Stuck-on, waxy, tan-brown papules and plaques  - Discussed benign etiology and prognosis. - Observe - Call for any changes  Melanocytic Nevi - Tan-brown and/or pink-flesh-colored symmetric macules and papules - Benign appearing on exam today - Observation - Call clinic for new or changing moles - Recommend daily use of broad spectrum spf 30+ sunscreen to sun-exposed areas.   Hemangiomas - Red papules - Discussed benign nature - Observe - Call for any changes  Actinic Damage - Chronic, secondary to cumulative UV/sun exposure - diffuse scaly erythematous macules with underlying dyspigmentation - Recommend daily broad spectrum sunscreen SPF 30+ to sun-exposed areas, reapply every 2 hours as needed.  - Call for new or changing lesions.  Skin cancer screening performed today.  AK (actinic keratosis) (2) Left medial cheek x 1, left forearm x 1  Destruction of lesion - Left medial cheek x 1, left forearm x 1 Complexity: simple   Destruction method: cryotherapy   Informed consent: discussed and consent obtained   Timeout:  patient name, date of birth, surgical site, and procedure verified Lesion destroyed using liquid nitrogen: Yes   Region frozen until ice  ball extended beyond lesion: Yes   Outcome: patient tolerated procedure well with no complications   Post-procedure details: wound care instructions given    Inflamed seborrheic keratosis (20) Face x 10, right forearm x 1, neck/chest x 9  Destruction of lesion - Face x 10, right forearm x 1, neck/chest x 9 Complexity: simple    Destruction method: cryotherapy   Informed consent: discussed and consent obtained   Timeout:  patient name, date of birth, surgical site, and procedure verified Lesion destroyed using liquid nitrogen: Yes   Region frozen until ice ball extended beyond lesion: Yes   Outcome: patient tolerated procedure well with no complications   Post-procedure details: wound care instructions given    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   Start Ketoconazole 2% cream qhs  ketoconazole (NIZORAL) 2 % cream - bilateral feet  Neoplasm of uncertain behavior of skin Right Lower Leg - Anterior  Skin / nail biopsy Type of biopsy: tangential   Informed consent: discussed and consent obtained   Timeout: patient name, date of birth, surgical site, and procedure verified   Procedure prep:  Patient was prepped and draped in usual sterile fashion Prep type:  Isopropyl alcohol Anesthesia: the lesion was anesthetized in a standard fashion   Anesthetic:  1% lidocaine w/ epinephrine 1-100,000 buffered w/ 8.4% NaHCO3 Instrument used: flexible razor blade   Hemostasis achieved with: pressure, aluminum chloride and electrodesiccation   Outcome: patient tolerated procedure well   Post-procedure details: sterile dressing applied and wound care instructions given   Dressing type: bandage and petrolatum    Specimen 1 - Surgical pathology Differential Diagnosis: BCC vs other Check Margins: No 1.5 cm pink patch  Return in about 2 months (around 04/24/2020) for AK follow up.  I, Ashok Cordia, CMA, am acting as scribe for Sarina Ser, MD .  Documentation: I have reviewed the above documentation for accuracy and completeness, and I agree with the above.  Sarina Ser, MD

## 2020-02-25 ENCOUNTER — Encounter: Payer: Self-pay | Admitting: Dermatology

## 2020-02-27 ENCOUNTER — Ambulatory Visit
Admission: RE | Admit: 2020-02-27 | Discharge: 2020-02-27 | Disposition: A | Discharge status: Home / Self Care | Payer: PPO | Source: Ambulatory Visit | Attending: Family Medicine | Admitting: Family Medicine

## 2020-02-27 ENCOUNTER — Other Ambulatory Visit: Payer: Self-pay

## 2020-02-27 ENCOUNTER — Telehealth: Payer: Self-pay

## 2020-02-27 DIAGNOSIS — R928 Other abnormal and inconclusive findings on diagnostic imaging of breast: Secondary | ICD-10-CM

## 2020-02-27 DIAGNOSIS — N6489 Other specified disorders of breast: Secondary | ICD-10-CM | POA: Insufficient documentation

## 2020-02-27 NOTE — Telephone Encounter (Signed)
-----   Message from Ralene Bathe, MD sent at 02/25/2020  4:54 PM EST ----- Diagnosis Skin , right lower leg - anterior SUPERFICIAL BASAL CELL CARCINOMA  Cancer - BCC Superficial Schedule for treatment [facility]) - or may treat at next scheduled visit

## 2020-02-27 NOTE — Telephone Encounter (Signed)
Left message on voicemail to return my call.  

## 2020-02-28 NOTE — Telephone Encounter (Signed)
Informed pt of results and treatment. Will do EDC at f/u appt 04/25/20. She had no concerns.

## 2020-03-05 DIAGNOSIS — H5203 Hypermetropia, bilateral: Secondary | ICD-10-CM | POA: Diagnosis not present

## 2020-03-05 DIAGNOSIS — E119 Type 2 diabetes mellitus without complications: Secondary | ICD-10-CM | POA: Diagnosis not present

## 2020-03-05 DIAGNOSIS — H2513 Age-related nuclear cataract, bilateral: Secondary | ICD-10-CM | POA: Diagnosis not present

## 2020-03-05 DIAGNOSIS — H53022 Refractive amblyopia, left eye: Secondary | ICD-10-CM | POA: Diagnosis not present

## 2020-04-17 DIAGNOSIS — I10 Essential (primary) hypertension: Secondary | ICD-10-CM | POA: Diagnosis not present

## 2020-04-17 DIAGNOSIS — L509 Urticaria, unspecified: Secondary | ICD-10-CM | POA: Diagnosis not present

## 2020-04-25 ENCOUNTER — Ambulatory Visit: Payer: PPO | Admitting: Dermatology

## 2020-04-25 ENCOUNTER — Encounter: Payer: Self-pay | Admitting: Dermatology

## 2020-04-25 ENCOUNTER — Other Ambulatory Visit: Payer: Self-pay

## 2020-04-25 DIAGNOSIS — C44719 Basal cell carcinoma of skin of left lower limb, including hip: Secondary | ICD-10-CM

## 2020-04-25 DIAGNOSIS — L820 Inflamed seborrheic keratosis: Secondary | ICD-10-CM

## 2020-04-25 DIAGNOSIS — L578 Other skin changes due to chronic exposure to nonionizing radiation: Secondary | ICD-10-CM

## 2020-04-25 DIAGNOSIS — L509 Urticaria, unspecified: Secondary | ICD-10-CM

## 2020-04-25 DIAGNOSIS — C4491 Basal cell carcinoma of skin, unspecified: Secondary | ICD-10-CM

## 2020-04-25 NOTE — Progress Notes (Signed)
Follow-Up Visit   Subjective  Cheryl Key is a 75 y.o. female who presents for the following: Follow-up (Pt here for treatment of superficial BCC R lower leg ) and Rash (Pt report itchy rash that will start in the evening started 1 month when pt had Covid, pt husband passed away 2 weeks which itching has gotten worse, taking Benadryl tablets).  The following portions of the chart were reviewed this encounter and updated as appropriate:   Tobacco  Allergies  Meds  Problems  Med Hx  Surg Hx  Fam Hx     Review of Systems:  No other skin or systemic complaints except as noted in HPI or Assessment and Plan.  Objective  Well appearing patient in no apparent distress; mood and affect are within normal limits.  A focused examination was performed including R Leg, face, arms . Relevant physical exam findings are noted in the Assessment and Plan.  Objective  Right Lower Leg - Anterior: Pink pearly papule or plaque with arborizing vessels.   Objective  Left Forearm - Anterior: Clear skin   Objective  L cheek: Erythematous keratotic or waxy stuck-on papule or plaque.    Assessment & Plan  Superficial basal cell carcinoma Right Lower Leg - Anterior  Destruction of lesion Complexity: extensive   Destruction method: electrodesiccation and curettage   Informed consent: discussed and consent obtained   Timeout:  patient name, date of birth, surgical site, and procedure verified Procedure prep:  Patient was prepped and draped in usual sterile fashion Prep type:  Isopropyl alcohol Anesthesia: the lesion was anesthetized in a standard fashion   Anesthetic:  1% lidocaine w/ epinephrine 1-100,000 buffered w/ 8.4% NaHCO3 Curettage performed in three different directions: Yes   Electrodesiccation performed over the curetted area: Yes   Curettage cycles:  3 Final wound size (cm):  2.2 Hemostasis achieved with:  pressure, aluminum chloride and electrodesiccation Outcome: patient  tolerated procedure well with no complications   Post-procedure details: sterile dressing applied and wound care instructions given   Dressing type: petrolatum    Urticaria -chronic and persistent.  May be related to Covid versus stress from husband's death. Left Forearm - Anterior Significant Urticaria-  photos reviewed from this morning pt brought in the office   Start  Allegra 180 mg take 1 tablet daily, if hives persistent after 3 days ;increase to 2 tablets daily.  If still problems after a week or so call the office Can continue Benadryl at night as needed If still no better with increase Allegra we will consider other treatment options   Inflamed seborrheic keratosis L cheek  Destruction of lesion - L cheek Complexity: simple   Destruction method: cryotherapy   Informed consent: discussed and consent obtained   Timeout:  patient name, date of birth, surgical site, and procedure verified Lesion destroyed using liquid nitrogen: Yes   Region frozen until ice ball extended beyond lesion: Yes   Outcome: patient tolerated procedure well with no complications   Post-procedure details: wound care instructions given    Actinic Damage - chronic, secondary to cumulative UV radiation exposure/sun exposure over time - diffuse scaly erythematous macules with underlying dyspigmentation - Recommend daily broad spectrum sunscreen SPF 30+ to sun-exposed areas, reapply every 2 hours as needed.  - Call for new or changing lesions.  Return in about 2 months (around 06/23/2020).  Marta Lamas, CMA, am acting as scribe for Sarina Ser, MD .  Documentation: I have reviewed the above documentation  for accuracy and completeness, and I agree with the above.  Sarina Ser, MD

## 2020-04-25 NOTE — Patient Instructions (Addendum)
Start  Allegra 180 mg take 1 tablet daily, if hives persistent increase to 2 tablets daily  If still no better with increase Allegra we will consider other treatment options   Can take Benadry at night if needed   Cryotherapy Aftercare  . Wash gently with soap and water everyday.   Marland Kitchen Apply Vaseline and Band-Aid daily until healed. Wound Care Instructions  On the day following your surgery, you should begin doing daily dressing changes: Remove the old dressing and discard it. Cleanse the wound gently with tap water. This may be done in the shower or by placing a wet gauze pad directly on the wound and letting it soak for several minutes. It is important to gently remove any dried blood from the wound in order to encourage healing. This may be done by gently rolling a moistened Q-tip on the dried blood. Do not pick at the wound. If the wound should start to bleed, continue cleaning the wound, then place a moist gauze pad on the wound and hold pressure for a few minutes.  Make sure you then dry the skin surrounding the wound completely or the tape will not stick to the skin. Do not use cotton balls on the wound. After the wound is clean and dry, apply the ointment gently with a Q-tip. Cut a non-stick pad to fit the size of the wound. Lay the pad flush to the wound. If the wound is draining, you may want to reinforce it with a small amount of gauze on top of the non-stick pad for a little added compression to the area. Use the tape to seal the area completely. Select from the following with respect to your individual situation: If your wound has been stitched closed: continue the above steps 1-8 at least daily until your sutures are removed. If your wound has been left open to heal: continue steps 1-8 at least daily for the first 3-4 weeks. We would like for you to take a few extra precautions for at least the next week. Sleep with your head elevated on pillows if our wound is on your head. Do  not bend over or lift heavy items to reduce the chance of elevated blood pressure to the wound Do not participate in particularly strenuous activities.   Below is a list of dressing supplies you might need.  Cotton-tipped applicators - Q-tips Gauze pads (2x2 and/or 4x4) - All-Purpose Sponges Non-stick dressing material - Telfa Tape - Paper or Hypafix New and clean tube of petroleum jelly - Vaseline    Comments on Post-Operative Period Slight swelling and redness often appear around the wound. This is normal and will disappear within several days following the surgery. The healing wound will drain a brownish-red-yellow discharge during healing. This is a normal phase of wound healing. As the wound begins to heal, the drainage may increase in amount. Again, this drainage is normal. Notify us if the drainage becomes persistently bloody, excessively swollen, or intensely painful or develops a foul odor or red streaks.  If you should experience mild discomfort during the healing phase, you may take an aspirin-free medication such as Tylenol (acetaminophen). Notify us if the discomfort is severe or persistent. Avoid alcoholic beverages when taking pain medicine.  In Case of Wound Hemorrhage A wound hemorrhage is when the bandage suddenly becomes soaked with bright red blood and flows profusely. If this happens, sit down or lie down with your head elevated. If the wound has a dressing on it, do  not remove the dressing. Apply pressure to the existing gauze. If the wound is not covered, use a gauze pad to apply pressure and continue applying the pressure for 20 minutes without peeking. DO NOT COVER THE WOUND WITH A LARGE TOWEL OR [place] CLOTH. Release your hand from the wound site but do not remove the dressing. If the bleeding has stopped, gently clean around the wound. Leave the dressing in place for 24 hours if possible. This wait time allows the blood vessels to close off so that you do not spark a new  round of bleeding by disrupting the newly clotted blood vessels with an immediate dressing change. If the bleeding does not subside, continue to hold pressure. If matters are out of your control, contact an After Hours clinic or go to the Emergency Room.  Marland Kitchen  Marland Kitchen

## 2020-05-01 ENCOUNTER — Telehealth: Payer: Self-pay

## 2020-05-01 MED ORDER — MONTELUKAST SODIUM 10 MG PO TABS: 10.0000 mg | ORAL_TABLET | Freq: Every day | ORAL | 1 refills | Status: DC

## 2020-05-01 NOTE — Telephone Encounter (Signed)
Patient did try Allegra twice daily with no improvement. Singulair sent in and patient advised of information per Dr. Nehemiah Massed.

## 2020-05-01 NOTE — Telephone Encounter (Signed)
Did she increase Allegra 180 mg to 2 a day yet?  If not, take 2 a day and call in 5 days. If she did already increase Allegra to 2 a day, then continue 2 a day and  Send in / Start Singulair (10 mg I think is correct dose - ask pharmacist) 1 po qd #30 1 rf. Call in a week to let us know how doing. If she is worsening, she can be worked in this week to re-evaluate (may consider oral steroids)

## 2020-05-01 NOTE — Telephone Encounter (Signed)
Patient called in this morning to let you know the [place]ma Freeze is not helping. After taking by mouth the hives subside for a few hours then they return.

## 2020-05-04 DIAGNOSIS — L509 Urticaria, unspecified: Secondary | ICD-10-CM | POA: Diagnosis not present

## 2020-05-16 DIAGNOSIS — G4733 Obstructive sleep apnea (adult) (pediatric): Secondary | ICD-10-CM | POA: Diagnosis not present

## 2020-05-23 DIAGNOSIS — E119 Type 2 diabetes mellitus without complications: Secondary | ICD-10-CM | POA: Diagnosis not present

## 2020-05-29 DIAGNOSIS — E119 Type 2 diabetes mellitus without complications: Secondary | ICD-10-CM | POA: Diagnosis not present

## 2020-05-29 DIAGNOSIS — L508 Other urticaria: Secondary | ICD-10-CM | POA: Diagnosis not present

## 2020-05-29 DIAGNOSIS — I10 Essential (primary) hypertension: Secondary | ICD-10-CM | POA: Diagnosis not present

## 2020-06-25 ENCOUNTER — Ambulatory Visit: Payer: PPO | Admitting: Dermatology

## 2020-06-25 ENCOUNTER — Other Ambulatory Visit: Payer: Self-pay | Admitting: Dermatology

## 2020-06-25 DIAGNOSIS — M79642 Pain in left hand: Secondary | ICD-10-CM | POA: Insufficient documentation

## 2020-06-28 ENCOUNTER — Ambulatory Visit: Payer: PPO | Admitting: Dermatology

## 2020-06-28 ENCOUNTER — Other Ambulatory Visit: Payer: Self-pay

## 2020-06-28 DIAGNOSIS — Z85828 Personal history of other malignant neoplasm of skin: Secondary | ICD-10-CM

## 2020-06-28 DIAGNOSIS — L501 Idiopathic urticaria: Secondary | ICD-10-CM | POA: Diagnosis not present

## 2020-06-28 DIAGNOSIS — L820 Inflamed seborrheic keratosis: Secondary | ICD-10-CM

## 2020-06-28 DIAGNOSIS — M65342 Trigger finger, left ring finger: Secondary | ICD-10-CM | POA: Diagnosis not present

## 2020-06-28 DIAGNOSIS — L508 Other urticaria: Secondary | ICD-10-CM

## 2020-06-28 DIAGNOSIS — M79642 Pain in left hand: Secondary | ICD-10-CM | POA: Diagnosis not present

## 2020-06-28 MED ORDER — EPINEPHRINE 0.3 MG/0.3ML IJ SOAJ: 0.3000 mg | INTRAMUSCULAR | 1 refills | Status: DC | PRN

## 2020-06-28 MED ORDER — MONTELUKAST SODIUM 10 MG PO TABS: ORAL_TABLET | ORAL | 1 refills | Status: DC

## 2020-06-28 NOTE — Progress Notes (Signed)
   Follow-Up Visit   Subjective  Cheryl Key is a 75 y.o. female who presents for the following: Follow-up (2 months f/u on irritated spot on the face, pt had this area treated with LN2 2 months ago area not gone completely away ). 2 months f/u chronic urticaria, pt report hives no better, pt taking Allegra 180 mg 2 tablets daily, she would like to know if she should be taking Zyrtec, she went to see another doctor today and  was given a steroid injection for her hand problem, which may help hives. Pt request a prescription for Epi-pen just to have at home. Pt report she had a very bad flare of hives yesterday. Hx of BCC right lower leg.  The following portions of the chart were reviewed this encounter and updated as appropriate:   Tobacco  Allergies  Meds  Problems  Med Hx  Surg Hx  Fam Hx     Review of Systems:  No other skin or systemic complaints except as noted in HPI or Assessment and Plan.  Objective  Well appearing patient in no apparent distress; mood and affect are within normal limits.  A focused examination was performed including face,legs,arms . Relevant physical exam findings are noted in the Assessment and Plan.  Objective  L cheek: Erythematous keratotic or waxy stuck-on papule or plaque.   Objective  trunk, exts: Skin clear today   Assessment & Plan  Inflamed seborrheic keratosis L cheek  Destruction of lesion - L cheek Complexity: simple   Destruction method: cryotherapy   Informed consent: discussed and consent obtained   Timeout:  patient name, date of birth, surgical site, and procedure verified Lesion destroyed using liquid nitrogen: Yes   Region frozen until ice ball extended beyond lesion: Yes   Outcome: patient tolerated procedure well with no complications   Post-procedure details: wound care instructions given    Chronic Idiopathic urticaria - several months with hives on skin daily trunk, exts Urticaria -chronic and persistent.  May be  related to Covid versus stress from husband's death. Significant Urticaria-    Cont  Allegra 180 mg take 2 tablet q am Start Claritin 10 mg take 1 tablet in the evening; after 1 week if no better Start Singulair 10 mg tablets daily   Labs ordered today May consider Xolair injection if not better at the next office visit  .ascurt Other Related Procedures CBC with Differential/Platelets CMP TSH Alpha-Gal Panel  Ordered Medications: EPINEPHrine 0.3 mg/0.3 mL IJ SOAJ injection montelukast (SINGULAIR) 10 MG tablet  History of Basal Cell Carcinoma of the Skin Hx of right lower leg - No evidence of recurrence today - Recommend regular full body skin exams - Recommend daily broad spectrum sunscreen SPF 30+ to sun-exposed areas, reapply every 2 hours as needed.  - Call if any new or changing lesions are noted between office visits  Return in about 4 weeks (around 07/26/2020) for urticaria .  IMarye Round, CMA, am acting as scribe for Sarina Ser, MD .  Documentation: I have reviewed the above documentation for accuracy and completeness, and I agree with the above.  Sarina Ser, MD

## 2020-06-28 NOTE — Patient Instructions (Addendum)

## 2020-07-02 DIAGNOSIS — L508 Other urticaria: Secondary | ICD-10-CM | POA: Diagnosis not present

## 2020-07-03 ENCOUNTER — Encounter: Payer: Self-pay | Admitting: Dermatology

## 2020-07-09 ENCOUNTER — Telehealth: Payer: Self-pay

## 2020-07-09 LAB — ALPHA-GAL PANEL
Allergen Lamb IgE: <0.1 kU/L
Beef IgE: <0.1 kU/L
IgE (Immunoglobulin E), Serum: 14 IU/mL (ref 6–495)
O215-IgE Alpha-Gal: <0.1 kU/L
Pork IgE: <0.1 kU/L

## 2020-07-09 LAB — COMPREHENSIVE METABOLIC PANEL
ALT: 18 IU/L (ref 0–32)
AST: 16 IU/L (ref 0–40)
Albumin/Globulin Ratio: 1.5 (ref 1.2–2.2)
Albumin: 4 g/dL (ref 3.7–4.7)
Alkaline Phosphatase: 107 IU/L (ref 44–121)
BUN/Creatinine Ratio: 13 (ref 12–28)
BUN: 14 mg/dL (ref 8–27)
Bilirubin Total: 0.3 mg/dL (ref 0.0–1.2)
CO2: 23 mmol/L (ref 20–29)
Calcium: 9 mg/dL (ref 8.7–10.3)
Chloride: 101 mmol/L (ref 96–106)
Creatinine, Ser: 1.04 mg/dL — ABNORMAL HIGH (ref 0.57–1.00)
Globulin, Total: 2.6 g/dL (ref 1.5–4.5)
Glucose: 158 mg/dL — ABNORMAL HIGH (ref 65–99)
Potassium: 4.4 mmol/L (ref 3.5–5.2)
Sodium: 139 mmol/L (ref 134–144)
Total Protein: 6.6 g/dL (ref 6.0–8.5)
eGFR: 57 mL/min/{1.73_m2} — ABNORMAL LOW (ref >59)

## 2020-07-09 LAB — CBC WITH DIFFERENTIAL/PLATELET
Basophils Absolute: 0 10*3/uL (ref 0.0–0.2)
Basos: 0 %
EOS (ABSOLUTE): 0.1 10*3/uL (ref 0.0–0.4)
Eos: 1 %
Hematocrit: 35.9 % (ref 34.0–46.6)
Hemoglobin: 11.9 g/dL (ref 11.1–15.9)
Immature Grans (Abs): 0 10*3/uL (ref 0.0–0.1)
Immature Granulocytes: 0 %
Lymphocytes Absolute: 2.7 10*3/uL (ref 0.7–3.1)
Lymphs: 31 %
MCH: 27.9 pg (ref 26.6–33.0)
MCHC: 33.1 g/dL (ref 31.5–35.7)
MCV: 84 fL (ref 79–97)
Monocytes Absolute: 0.6 10*3/uL (ref 0.1–0.9)
Monocytes: 7 %
Neutrophils Absolute: 5.4 10*3/uL (ref 1.4–7.0)
Neutrophils: 61 %
Platelets: 231 10*3/uL (ref 150–450)
RBC: 4.26 x10E6/uL (ref 3.77–5.28)
RDW: 13.4 % (ref 11.7–15.4)
WBC: 8.8 10*3/uL (ref 3.4–10.8)

## 2020-07-09 LAB — TSH: TSH: 1.8 u[IU]/mL (ref 0.450–4.500)

## 2020-07-09 NOTE — Telephone Encounter (Signed)
-----   Message from Ralene Bathe, MD sent at 07/09/2020  8:06 AM EDT ----- Lab from 07/02/2020 shows: Chemistries all OK except mild elevation of Glucose and Creatinine - discuss with PCP (unlikely related to hives) CBC/ blood count = normal / OK Thyroid / TSH = normal /OK Alpha-Gal IgE levels all Normal / / negative / OK  Continue current Hives treatment. Keep follow up appts.

## 2020-07-09 NOTE — Telephone Encounter (Signed)
Discussed lab results with patient Alpha gal negative, chemistries ok except mild elevation of Glucose and Creatinine recommend pt discuss with her PCP.  Pt report she read Pepcid may elevate labs so she stopped Pepcid Keep scheduled appt here

## 2020-07-09 NOTE — Telephone Encounter (Signed)
Fax from [place] states patient declined Singular 10mg  RX due to black box warning causing neuro-psychiatric side effects. Patient asked pharmacy to send request for Hydroxyzine [facility] for her itching.

## 2020-07-09 NOTE — Telephone Encounter (Signed)
May send Hydroxyzine 10 mg 1 po qhs for itch and hives.  May make drowsy. Disp #30 with 1 rf

## 2020-07-10 MED ORDER — HYDROXYZINE HCL 10 MG PO TABS: 10.0000 mg | ORAL_TABLET | Freq: Every evening | ORAL | 1 refills | Status: AC

## 2020-07-10 NOTE — Telephone Encounter (Signed)
RX sent in and MyChart message sent for patient.

## 2020-07-26 ENCOUNTER — Ambulatory Visit: Payer: PPO | Admitting: Dermatology

## 2020-07-26 ENCOUNTER — Other Ambulatory Visit: Payer: Self-pay

## 2020-07-26 ENCOUNTER — Encounter: Payer: Self-pay | Admitting: Dermatology

## 2020-07-26 DIAGNOSIS — L820 Inflamed seborrheic keratosis: Secondary | ICD-10-CM | POA: Diagnosis not present

## 2020-07-26 DIAGNOSIS — L509 Urticaria, unspecified: Secondary | ICD-10-CM

## 2020-07-26 DIAGNOSIS — I872 Venous insufficiency (chronic) (peripheral): Secondary | ICD-10-CM | POA: Diagnosis not present

## 2020-07-26 DIAGNOSIS — Z85828 Personal history of other malignant neoplasm of skin: Secondary | ICD-10-CM

## 2020-07-26 NOTE — Progress Notes (Signed)
   Follow-Up Visit   Subjective  Cheryl Key is a 75 y.o. female who presents for the following: Follow-up (4 weeks f/u Urticaria, pt taking Zyrtec 1 tablet daily and Allegra 180 mg 1 tablet daily with a good response pt feel much better, past meds Singulair, Benadry, Claritin not much help. ) and Seborrheic Keratosis (4 weeks f/u on L cheek ISK treated with LN2 ). Hx of BCC   The following portions of the chart were reviewed this encounter and updated as appropriate:   Tobacco  Allergies  Meds  Problems  Med Hx  Surg Hx  Fam Hx     Review of Systems:  No other skin or systemic complaints except as noted in HPI or Assessment and Plan.  Objective  Well appearing patient in no apparent distress; mood and affect are within normal limits.  A focused examination was performed including face, exts, trunk . Relevant physical exam findings are noted in the Assessment and Plan.  Objective  trunk, exts: Clear today   Objective  lower legs: scaly patches involving the ankle and distal lower leg with associated lower leg edema.   Objective  left medial cheek: Erythematous keratotic or waxy stuck-on papule or plaque.    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  Recommend daily compression hose/stockings- easiest to put on first thing in morning, remove at bedtime.  Elevate legs as much as possible. Avoid salt/sodium rich foods.  Start otc graduated compressions daily   Inflamed seborrheic  keratosis left medial cheek Destruction of lesion - left medial cheek Complexity: simple   Destruction method: cryotherapy   Informed consent: discussed and consent obtained   Timeout:  patient name, date of birth, surgical site, and procedure verified Lesion destroyed using liquid nitrogen: Yes   Region frozen until ice ball extended beyond lesion: Yes   Outcome: patient tolerated procedure well with no complications   Post-procedure details: wound care instructions given    History of Basal Cell Carcinoma of the Skin Multiple see history  - No evidence of recurrence today - Recommend regular full body skin exams - Recommend daily broad spectrum sunscreen SPF 30+ to sun-exposed areas, reapply every 2 hours as needed.  - Call if any new or changing lesions are noted between office visits  Return in about 6 months (around 01/26/2021) for urticaria, .  I, Marye Round, CMA, am acting as scribe for Sarina Ser, MD .  Documentation: I have reviewed the above documentation for accuracy and completeness, and I agree with the above.  Sarina Ser, MD

## 2020-07-26 NOTE — Patient Instructions (Signed)

## 2020-07-30 DIAGNOSIS — M65342 Trigger finger, left ring finger: Secondary | ICD-10-CM | POA: Insufficient documentation

## 2020-07-30 DIAGNOSIS — M79642 Pain in left hand: Secondary | ICD-10-CM | POA: Diagnosis not present

## 2020-08-03 ENCOUNTER — Encounter: Payer: Self-pay | Admitting: Dermatology

## 2020-08-14 DIAGNOSIS — G4733 Obstructive sleep apnea (adult) (pediatric): Secondary | ICD-10-CM | POA: Diagnosis not present

## 2020-10-04 DIAGNOSIS — L03011 Cellulitis of right finger: Secondary | ICD-10-CM | POA: Diagnosis not present

## 2020-10-08 ENCOUNTER — Ambulatory Visit: Payer: PPO | Admitting: Podiatry

## 2020-10-17 ENCOUNTER — Encounter: Payer: Self-pay | Admitting: Podiatry

## 2020-10-17 ENCOUNTER — Ambulatory Visit: Payer: PPO | Admitting: Podiatry

## 2020-10-17 ENCOUNTER — Other Ambulatory Visit: Payer: Self-pay

## 2020-10-17 DIAGNOSIS — M778 Other enthesopathies, not elsewhere classified: Secondary | ICD-10-CM | POA: Diagnosis not present

## 2020-10-17 DIAGNOSIS — T7840XA Allergy, unspecified, initial encounter: Secondary | ICD-10-CM | POA: Insufficient documentation

## 2020-10-17 DIAGNOSIS — D2372 Other benign neoplasm of skin of left lower limb, including hip: Secondary | ICD-10-CM

## 2020-10-17 DIAGNOSIS — G43909 Migraine, unspecified, not intractable, without status migrainosus: Secondary | ICD-10-CM | POA: Insufficient documentation

## 2020-10-17 DIAGNOSIS — E119 Type 2 diabetes mellitus without complications: Secondary | ICD-10-CM | POA: Insufficient documentation

## 2020-10-17 DIAGNOSIS — B159 Hepatitis A without hepatic coma: Secondary | ICD-10-CM | POA: Insufficient documentation

## 2020-10-17 DIAGNOSIS — G5600 Carpal tunnel syndrome, unspecified upper limb: Secondary | ICD-10-CM | POA: Insufficient documentation

## 2020-10-17 DIAGNOSIS — E785 Hyperlipidemia, unspecified: Secondary | ICD-10-CM

## 2020-10-17 HISTORY — DX: Hyperlipidemia, unspecified: E78.5

## 2020-10-17 MED ORDER — DEXAMETHASONE SODIUM PHOSPHATE 120 MG/30ML IJ SOLN
2.0000 mg | Freq: Once | INTRAMUSCULAR | Status: AC
  Administered 2020-10-17: 2 mg via INTRA_ARTICULAR

## 2020-10-17 NOTE — Progress Notes (Signed)
Subjective:  Patient ID: Cheryl Key, female    DOB: 11-Sep-1945,  MRN: [id number] HPI Chief Complaint  Patient presents with   Foot Pain    Sub 5th MPJ left - small, callused area x several months, tried trimming   New Patient (Initial Visit)    75 y.o. female presents with the above complaint.   ROS: Denies fever chills nausea vomit muscle aches pains calf pain back pain chest pain shortness of breath.  Past Medical History:  Diagnosis Date   Basal cell carcinoma 02/22/2020   right lower leg superficial    Cancer (HCC)    basal cell skin cancer   Carpal tunnel syndrome    Gastritis    GERD (gastroesophageal reflux disease)    History of hepatitis A    Hx of basal cell carcinoma 02/16/2019   Left upper back sup. med. scapula. Infiltrative. Excised 04/12/2019   Hx of dysplastic nevus 01/07/2007   Right pretibial    Hyperlipidemia 10/17/2020   Melanocytic nevus 02/21/2019   right neck   Migraines    Other and unspecified angina pectoris    Unspecified essential hypertension    Past Surgical History:  Procedure Laterality Date   basel cell removal     colonoscopy     COLONOSCOPY WITH PROPOFOL N/A 03/30/2017   Procedure: COLONOSCOPY WITH PROPOFOL;  Surgeon: Manya Silvas, MD;  Location: [facility] ENDOSCOPY;  Service: Endoscopy;  Laterality: N/A;   ESOPHAGOGASTRODUODENOSCOPY (EGD) WITH PROPOFOL N/A 03/30/2017   Procedure: ESOPHAGOGASTRODUODENOSCOPY (EGD) WITH PROPOFOL;  Surgeon: Manya Silvas, MD;  Location: [facility] ENDOSCOPY;  Service: Endoscopy;  Laterality: N/A;   FOOT SURGERY     x2   GALLBLADDER SURGERY     ORIF SHOULDER FRACTURE     UPPER GI ENDOSCOPY     VAGINAL HYSTERECTOMY  1986   endometriosis    Current Outpatient Medications:    amLODipine (NORVASC) 5 MG tablet, Take 5 mg by mouth daily., Disp: , Rfl:    calcium-vitamin D (OSCAL WITH D) 500-200 MG-UNIT tablet, Take 1 tablet by mouth 2 (two) times daily., Disp: , Rfl:    EPINEPHrine 0.3 mg/0.3 mL IJ SOAJ  injection, Inject 0.3 mg into the muscle as needed for anaphylaxis., Disp: 1 each, Rfl: 1   famotidine (PEPCID) 20 MG tablet, Take 20 mg by mouth 2 (two) times daily., Disp: , Rfl:    fluticasone (FLONASE) 50 MCG/ACT nasal spray, Place 2 sprays into both nostrils daily., Disp: , Rfl:    glucosamine-chondroitin 500-400 MG tablet, Take 1 tablet by mouth 3 (three) times daily., Disp: , Rfl:    indapamide (LOZOL) 1.25 MG tablet, Take 1.25 mg by mouth daily., Disp: , Rfl:    ketoconazole (NIZORAL) 2 % cream, Apply 1 application topically at bedtime., Disp: 60 g, Rfl: 3   losartan (COZAAR) 100 MG tablet, Take 100 mg by mouth daily., Disp: , Rfl:    metFORMIN (GLUCOPHAGE-XR) 500 MG 24 hr tablet, Take 500 mg by mouth 2 (two) times daily., Disp: , Rfl:    metoprolol succinate (TOPROL-XL) 50 MG 24 hr tablet, Take 50 mg by mouth daily. Take with or immediately following a meal., Disp: , Rfl:    montelukast (SINGULAIR) 10 MG tablet, Take 1 tablet (10 mg total) by mouth at bedtime., Disp: 30 tablet, Rfl: 0   montelukast (SINGULAIR) 10 MG tablet, Take 1 tablet daily, Disp: 30 tablet, Rfl: 1   Multiple Vitamin (MULTIVITAMIN) tablet, Take 1 tablet by mouth daily., Disp: , Rfl:  promethazine-phenylephrine (PROMETHAZINE VC) 6.25-5 MG/5ML SYRP, Take 5 mLs by mouth every 4 (four) hours as needed for congestion., Disp: 118 mL, Rfl: 0  Allergies  Allergen Reactions   Fluconazole Hives   Levofloxacin     Other reaction(s): Other (See Comments) Tendon tear   Lidocaine     Other reaction(s): Other (See Comments) hypotension   Aleve [Naproxen Sodium] Hives   Augmentin [Amoxicillin-Pot Clavulanate] Hives   Cefuroxime Hives   Naprosyn [Naproxen] Hives   Review of Systems Objective:  There were no vitals filed for this visit.  General: Well developed, nourished, in no acute distress, alert and oriented x3   Dermatological: Skin is warm, dry and supple bilateral. Nails x 10 are well maintained; remaining  integument appears unremarkable at this time. There are no open sores, no preulcerative lesions, no rash or signs of infection present.  Solitary porokeratosis subfifth met head left foot  Vascular: Dorsalis Pedis artery and Posterior Tibial artery pedal pulses are 2/4 bilateral with immedate capillary fill time. Pedal hair growth present. No varicosities and no lower extremity edema present bilateral.   Neruologic: Grossly intact via light touch bilateral. Vibratory intact via tuning fork bilateral. Protective threshold with Semmes Wienstein monofilament intact to all pedal sites bilateral. Patellar and Achilles deep tendon reflexes 2+ bilateral. No Babinski or clonus noted bilateral.   Musculoskeletal: No gross boney pedal deformities bilateral. No pain, crepitus, or limitation noted with foot and ankle range of motion bilateral. Muscular strength 5/5 in all groups tested bilateral.  Palpable bursa with mild overlying erythema subfifth met head left foot  Gait: Unassisted, Nonantalgic.    Radiographs:  Radiographs none taken  Assessment & Plan:   Assessment: Bursitis and porokeratosis of fifth met head left foot  Plan: Discussed etiology pathology conservative surgical therapies at this point I injected 2 mg of dexamethasone local anesthetic into the bursa.  I also debrided the porokeratotic lesion or the benign skin lesion.  I will follow-up with her on an as-needed basis.     Phill Steck T. [place], [place]

## 2020-11-15 DIAGNOSIS — G4733 Obstructive sleep apnea (adult) (pediatric): Secondary | ICD-10-CM | POA: Diagnosis not present

## 2020-11-22 DIAGNOSIS — E119 Type 2 diabetes mellitus without complications: Secondary | ICD-10-CM | POA: Diagnosis not present

## 2020-11-22 DIAGNOSIS — I10 Essential (primary) hypertension: Secondary | ICD-10-CM | POA: Diagnosis not present

## 2020-11-23 DIAGNOSIS — R8290 Unspecified abnormal findings in urine: Secondary | ICD-10-CM | POA: Diagnosis not present

## 2020-11-29 DIAGNOSIS — I10 Essential (primary) hypertension: Secondary | ICD-10-CM | POA: Diagnosis not present

## 2020-11-29 DIAGNOSIS — L509 Urticaria, unspecified: Secondary | ICD-10-CM | POA: Diagnosis not present

## 2020-11-29 DIAGNOSIS — E785 Hyperlipidemia, unspecified: Secondary | ICD-10-CM | POA: Diagnosis not present

## 2020-11-29 DIAGNOSIS — E119 Type 2 diabetes mellitus without complications: Secondary | ICD-10-CM | POA: Diagnosis not present

## 2020-11-29 DIAGNOSIS — E871 Hypo-osmolality and hyponatremia: Secondary | ICD-10-CM | POA: Diagnosis not present

## 2020-11-29 DIAGNOSIS — E1165 Type 2 diabetes mellitus with hyperglycemia: Secondary | ICD-10-CM | POA: Diagnosis not present

## 2020-11-29 DIAGNOSIS — N3000 Acute cystitis without hematuria: Secondary | ICD-10-CM | POA: Diagnosis not present

## 2020-12-13 DIAGNOSIS — N3000 Acute cystitis without hematuria: Secondary | ICD-10-CM | POA: Diagnosis not present

## 2020-12-13 DIAGNOSIS — E871 Hypo-osmolality and hyponatremia: Secondary | ICD-10-CM | POA: Diagnosis not present

## 2021-01-10 ENCOUNTER — Other Ambulatory Visit: Payer: Self-pay

## 2021-01-10 ENCOUNTER — Ambulatory Visit
Admission: RE | Admit: 2021-01-10 | Discharge: 2021-01-10 | Disposition: A | Discharge status: Home / Self Care | Payer: PPO | Source: Ambulatory Visit | Attending: Otolaryngology | Admitting: Otolaryngology

## 2021-01-10 DIAGNOSIS — E041 Nontoxic single thyroid nodule: Secondary | ICD-10-CM | POA: Insufficient documentation

## 2021-01-16 ENCOUNTER — Ambulatory Visit: Payer: PPO | Admitting: Dermatology

## 2021-01-21 DIAGNOSIS — G4733 Obstructive sleep apnea (adult) (pediatric): Secondary | ICD-10-CM | POA: Diagnosis not present

## 2021-01-21 DIAGNOSIS — H903 Sensorineural hearing loss, bilateral: Secondary | ICD-10-CM | POA: Diagnosis not present

## 2021-01-21 DIAGNOSIS — H9319 Tinnitus, unspecified ear: Secondary | ICD-10-CM | POA: Diagnosis not present

## 2021-01-21 DIAGNOSIS — E041 Nontoxic single thyroid nodule: Secondary | ICD-10-CM | POA: Diagnosis not present

## 2021-01-21 DIAGNOSIS — E0910 Drug or chemical induced diabetes mellitus with ketoacidosis without coma: Secondary | ICD-10-CM | POA: Diagnosis not present

## 2021-02-25 ENCOUNTER — Ambulatory Visit (INDEPENDENT_AMBULATORY_CARE_PROVIDER_SITE_OTHER): Payer: PPO | Admitting: Dermatology

## 2021-02-25 ENCOUNTER — Other Ambulatory Visit: Payer: Self-pay

## 2021-02-25 DIAGNOSIS — L578 Other skin changes due to chronic exposure to nonionizing radiation: Secondary | ICD-10-CM

## 2021-02-25 DIAGNOSIS — B356 Tinea cruris: Secondary | ICD-10-CM | POA: Diagnosis not present

## 2021-02-25 DIAGNOSIS — L304 Erythema intertrigo: Secondary | ICD-10-CM

## 2021-02-25 DIAGNOSIS — D235 Other benign neoplasm of skin of trunk: Secondary | ICD-10-CM

## 2021-02-25 DIAGNOSIS — L814 Other melanin hyperpigmentation: Secondary | ICD-10-CM | POA: Diagnosis not present

## 2021-02-25 DIAGNOSIS — L821 Other seborrheic keratosis: Secondary | ICD-10-CM | POA: Diagnosis not present

## 2021-02-25 DIAGNOSIS — D229 Melanocytic nevi, unspecified: Secondary | ICD-10-CM | POA: Diagnosis not present

## 2021-02-25 DIAGNOSIS — D1800 Hemangioma unspecified site: Secondary | ICD-10-CM | POA: Diagnosis not present

## 2021-02-25 DIAGNOSIS — L820 Inflamed seborrheic keratosis: Secondary | ICD-10-CM | POA: Diagnosis not present

## 2021-02-25 DIAGNOSIS — Z85828 Personal history of other malignant neoplasm of skin: Secondary | ICD-10-CM | POA: Diagnosis not present

## 2021-02-25 DIAGNOSIS — Z1283 Encounter for screening for malignant neoplasm of skin: Secondary | ICD-10-CM | POA: Diagnosis not present

## 2021-02-25 MED ORDER — TERBINAFINE HCL 250 MG PO TABS: 250.0000 mg | ORAL_TABLET | Freq: Every day | ORAL | 0 refills | Status: DC

## 2021-02-25 MED ORDER — KETOCONAZOLE 2 % EX CREA
1.0000 | TOPICAL_CREAM | Freq: Every day | CUTANEOUS | 3 refills | Status: AC
Start: 2021-02-25 — End: 2021-04-08

## 2021-02-25 NOTE — Progress Notes (Signed)
Follow-Up Visit   Subjective  Cheryl Key is a 75 y.o. female who presents for the following: Follow-up (Patient here for 1 year tbse. Patient reports a area at right groin she would like checked. Patient also has pictures. Patient has history of skin cancer. ). The patient presents for Total-Body Skin Exam (TBSE) for skin cancer screening and mole check.  The patient has spots, moles and lesions to be evaluated, some may be new or changing and the patient has concerns that these could be cancer.  The following portions of the chart were reviewed this encounter and updated as appropriate:  Tobacco   Allergies   Meds   Problems   Med Hx   Surg Hx   Fam Hx      Review of Systems: No other skin or systemic complaints except as noted in HPI or Assessment and Plan.  Objective  Well appearing patient in no apparent distress; mood and affect are within normal limits.  A full examination was performed including scalp, head, eyes, ears, nose, lips, neck, chest, axillae, abdomen, back, buttocks, bilateral upper extremities, bilateral lower extremities, hands, feet, fingers, toes, fingernails, and toenails. All findings within normal limits unless otherwise noted below.  right temple x 1, back x 4, right posterior thigh x 1 (6) Erythematous stuck-on, waxy papule or plaque   Assessment & Plan  Inflamed seborrheic keratosis (6) right temple x 1, back x 4, right posterior thigh x 1  Destruction of lesion - right temple x 1, back x 4, right posterior thigh x 1 Complexity: simple   Destruction method: cryotherapy   Informed consent: discussed and consent obtained   Timeout:  patient name, date of birth, surgical site, and procedure verified Lesion destroyed using liquid nitrogen: Yes   Region frozen until ice ball extended beyond lesion: Yes   Outcome: patient tolerated procedure well with no complications   Post-procedure details: wound care instructions given   Additional details:  Prior to  procedure, discussed risks of blister formation, small wound, skin dyspigmentation, or rare scar following cryotherapy. Recommend Vaseline ointment to treated areas while healing.  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 14 tablets 0 rf  Start Ketoconazole 2 % cream - apply topically to affected area at groin at bedtime. 6 weeks for yeast   Intertrigo is a chronic recurrent rash that occurs in skin fold areas that may be associated with friction; heat; moisture; yeast; fungus; and bacteria.  It is exacerbated by increased movement / activity; sweating; and higher atmospheric temperature.  Terbinafine Counseling  Terbinafine is an anti-fungal medicine that can be applied to the skin (over the counter) or taken by mouth (prescription) to treat fungal infections. The pill version is often used to treat fungal infections of the nails or scalp. While most people do not have any side effects from taking terbinafine pills, some possible side effects of the medicine can include taste changes, headache, loss of smell, vision changes, nausea, vomiting, or diarrhea.   Rare side effects can include irritation of the liver, allergic reaction, or decrease in blood counts (which may show up as not feeling well or developing an infection). If you are concerned about any of these side effects, please stop the medicine and call your doctor, or in the case of an emergency such as feeling very unwell, seek immediate medical care.   ketoconazole (NIZORAL) 2 % cream -  groin Apply 1 application topically at bedtime. Apply to affected areas of groin. For 6 weeks for yeast  terbinafine (LAMISIL) 250 MG tablet - groin Take 1 tablet (250 mg total) by mouth daily. For yeast  Skin cancer screening  Lentigines - Scattered tan macules - Due to sun exposure - Benign-appearing, observe - Recommend daily broad spectrum sunscreen SPF  30+ to sun-exposed areas, reapply every 2 hours as needed. - Call for any changes  Seborrheic Keratoses - Stuck-on, waxy, tan-brown papules and/or plaques  - Benign-appearing - Discussed benign etiology and prognosis. - Observe - Call for any changes  Dermatofibroma - Firm pink/brown papulenodule with dimple sign right breast  - Benign appearing - Call for any changes  Melanocytic Nevi - Tan-brown and/or pink-flesh-colored symmetric macules and papules - Benign appearing on exam today - Observation - Call clinic for new or changing moles - Recommend daily use of broad spectrum spf 30+ sunscreen to sun-exposed areas.   Hemangiomas - Red papules - Discussed benign nature - Observe - Call for any changes  Actinic Damage - Chronic condition, secondary to cumulative UV/sun exposure - diffuse scaly erythematous macules with underlying dyspigmentation - Recommend daily broad spectrum sunscreen SPF 30+ to sun-exposed areas, reapply every 2 hours as needed.  - Staying in the shade or wearing long sleeves, sun glasses (UVA+UVB protection) and wide brim hats (4-inch brim around the entire circumference of the hat) are also recommended for sun protection.  - Call for new or changing lesions.  History of Dysplastic Nevi - No evidence of recurrence today - Recommend regular full body skin exams - Recommend daily broad spectrum sunscreen SPF 30+ to sun-exposed areas, reapply every 2 hours as needed.  - Call if any new or changing lesions are noted between office visits  History of Basal Cell Carcinoma of the Skin - No evidence of recurrence today - Recommend regular full body skin exams - Recommend daily broad spectrum sunscreen SPF 30+ to sun-exposed areas, reapply every 2 hours as needed.  - Call if any new or changing lesions are noted between office visits  Skin cancer screening performed today.  Return for 1 year tbse. IRuthell Rummage, CMA, am acting as scribe for Sarina Ser, MD. Documentation: I have reviewed the above documentation for accuracy and completeness, and I agree with the above.  Sarina Ser, MD

## 2021-02-25 NOTE — Patient Instructions (Addendum)
Terbinafine Counseling  Terbinafine is an anti-fungal medicine that can be applied to the skin (over the counter) or taken by mouth (prescription) to treat fungal infections. The pill version is often used to treat fungal infections of the nails or scalp. While most people do not have any side effects from taking terbinafine pills, some possible side effects of the medicine can include taste changes, headache, loss of smell, vision changes, nausea, vomiting, or diarrhea.   Rare side effects can include irritation of the liver, allergic reaction, or decrease in blood counts (which may show up as not feeling well or developing an infection). If you are concerned about any of these side effects, please stop the medicine and call your doctor, or in the case of an emergency such as feeling very unwell, seek immediate medical care.   Cryotherapy Aftercare  Wash gently with soap and water everyday.   Apply Vaseline and Band-Aid daily until healed.      Melanoma ABCDEs  Melanoma is the most dangerous type of skin cancer, and is the leading cause of death from skin disease.  You are more likely to develop melanoma if you: Have light-colored skin, light-colored eyes, or red or blond hair Spend a lot of time in the sun Tan regularly, either outdoors or in a tanning bed Have had blistering sunburns, especially during childhood Have a close family member who has had a melanoma Have atypical moles or large birthmarks  Early detection of melanoma is key since treatment is typically straightforward and cure rates are extremely high if we catch it early.   The first sign of melanoma is often a change in a mole or a new dark spot.  The ABCDE system is a way of remembering the signs of melanoma.  A for asymmetry:  The two halves do not match. B for border:  The edges of the growth are irregular. C for color:  A mixture of colors are present instead of an even brown color. D for diameter:  Melanomas are  usually (but not always) greater than 4mm - the size of a pencil eraser. E for evolution:  The spot keeps changing in size, shape, and color.  Please check your skin once per month between visits. You can use a small mirror in front and a large mirror behind you to keep an eye on the back side or your body.   If you see any new or changing lesions before your next follow-up, please call to schedule a visit.  Please continue daily skin protection including broad spectrum sunscreen SPF 30+ to sun-exposed areas, reapplying every 2 hours as needed when you're outdoors.   Staying in the shade or wearing long sleeves, sun glasses (UVA+UVB protection) and wide brim hats (4-inch brim around the entire circumference of the hat) are also recommended for sun protection.    If You Need Anything After Your Visit  If you have any questions or concerns for your doctor, please call our main line at [phone] and press option 4 to reach your doctor's medical assistant. If no one answers, please leave a voicemail as directed and we will return your call as soon as possible. Messages left after 4 pm will be answered the following business day.   You may also send [place] a message via [place]. We typically respond to MyChart messages within 1-2 business days.  For prescription refills, please ask your pharmacy to contact our office. Our fax number is [phone].  If you have an  urgent issue when the clinic is closed that cannot wait until the next business day, you can page your doctor at the number below.    Please note that while we do our best to be available for urgent issues outside of office hours, we are not available 24/7.   If you have an urgent issue and are unable to reach [place], you may choose to seek medical care at your doctor's office, retail clinic, urgent care center, or emergency room.  If you have a medical emergency, please immediately call 911 or go to the emergency department.  Pager  Numbers  - Dr. Nehemiah Massed: [phone]  - Dr. Laurence Ferrari: [phone]  - Dr. Nicole Kindred: [phone]  In the event of inclement weather, please call our main line at [phone] for an update on the status of any delays or closures.  Dermatology Medication Tips: Please keep the boxes that topical medications come in in order to help keep track of the instructions about where and how to use these. Pharmacies typically print the medication instructions only on the boxes and not directly on the medication tubes.   If your medication is too expensive, please contact our office at [phone] option 4 or send [place] a message through [place].   We are unable to tell what your co-pay for medications will be in advance as this is different depending on your insurance coverage. However, we may be able to find a substitute medication at lower cost or fill out paperwork to get insurance to cover a needed medication.   If a prior authorization is required to get your medication covered by your insurance company, please allow [place] 1-2 business days to complete this process.  Drug prices often vary depending on where the prescription is filled and some pharmacies may offer cheaper prices.  The website www.goodrx.com contains coupons for medications through different pharmacies. The prices here do not account for what the cost may be with help from insurance (it may be cheaper with your insurance), but the website can give you the price if you did not use any insurance.  - You can print the associated coupon and take it with your prescription to the pharmacy.  - You may also stop by our office during regular business hours and pick up a GoodRx coupon card.  - If you need your prescription sent electronically to a different pharmacy, notify our office through [facility] or by phone at [phone] option 4.     Si Usted Necesita Algo Despus de Su Visita  Tambin puede enviarnos un mensaje a travs de  Pharmacist, community. Por lo general respondemos a los mensajes de MyChart en el transcurso de 1 a 2 das hbiles.  Para renovar recetas, por favor pida a su farmacia que se ponga en contacto con nuestra oficina. Harland Dingwall de fax es [place] [phone].  Si tiene un asunto urgente cuando la clnica est cerrada y que no puede esperar hasta el siguiente da hbil, puede llamar/localizar a su doctor(a) al nmero que aparece a continuacin.   Por favor, tenga en cuenta que aunque hacemos todo lo posible para estar disponibles para asuntos urgentes fuera del horario de [place], no estamos disponibles las 24 horas del da, los 7 das de la [place].   Si tiene un problema urgente y no puede comunicarse con nosotros, puede optar por buscar atencin mdica  en el consultorio de su doctor(a), en una clnica privada, en un centro de atencin urgente o en una sala de emergencias.  Si tiene Engineering geologist, por favor llame inmediatamente al 911 o vaya a la sala de emergencias.  Nmeros de bper  - Dr. Nehemiah Massed: [phone]  - Dra. Moye: [phone]  - Dra. Nicole Kindred: [phone]  En caso de inclemencias del [place], por favor llame a Johnsie Kindred principal al [phone] para una actualizacin sobre el [place] de cualquier retraso o cierre.  Consejos para la medicacin en dermatologa: Por favor, guarde las cajas en las que vienen los medicamentos de uso tpico para ayudarle a seguir las instrucciones sobre dnde y cmo usarlos. Las farmacias generalmente imprimen las instrucciones del medicamento slo en las cajas y no directamente en los tubos del [place].   Si su medicamento es muy caro, por favor, pngase en contacto con Zigmund Daniel llamando al [phone] y presione la opcin 4 o envenos un mensaje a travs de Pharmacist, community.   No podemos decirle cul ser su copago por los medicamentos por adelantado ya que esto es diferente dependiendo de la cobertura de su seguro. Sin embargo, es posible que  podamos encontrar un medicamento sustituto a Electrical engineer un formulario para que el seguro cubra el medicamento que se considera necesario.   Si se requiere una autorizacin previa para que su compaa de seguros [place] su medicamento, por favor permtanos de 1 a 2 das hbiles para completar este proceso.  Los precios de los medicamentos varan con frecuencia dependiendo del Environmental consultant de dnde se surte la receta y alguna farmacias pueden ofrecer precios ms baratos.  El sitio web www.goodrx.com tiene cupones para medicamentos de Airline pilot. Los precios aqu no tienen en cuenta lo que podra costar con la ayuda del seguro (puede ser ms barato con su seguro), pero el sitio web puede darle el precio si no utiliz Research scientist (physical sciences).  - Puede imprimir el cupn correspondiente y llevarlo con su receta a la farmacia.  - Tambin puede pasar por nuestra oficina durante el horario de atencin regular y Charity fundraiser una tarjeta de cupones de GoodRx.  - Si necesita que su receta se enve electrnicamente a una farmacia diferente, informe a nuestra oficina a travs de MyChart de [place] o por telfono llamando al [phone] y presione la opcin 4.

## 2021-02-26 ENCOUNTER — Other Ambulatory Visit: Payer: Self-pay | Admitting: Family Medicine

## 2021-02-26 DIAGNOSIS — Z1231 Encounter for screening mammogram for malignant neoplasm of breast: Secondary | ICD-10-CM

## 2021-03-07 ENCOUNTER — Ambulatory Visit
Admission: RE | Admit: 2021-03-07 | Discharge: 2021-03-07 | Disposition: A | Discharge status: Home / Self Care | Payer: PPO | Source: Ambulatory Visit | Attending: Family Medicine | Admitting: Family Medicine

## 2021-03-07 ENCOUNTER — Other Ambulatory Visit: Payer: Self-pay

## 2021-03-07 DIAGNOSIS — Z1231 Encounter for screening mammogram for malignant neoplasm of breast: Secondary | ICD-10-CM | POA: Diagnosis not present

## 2021-03-09 ENCOUNTER — Encounter: Payer: Self-pay | Admitting: Dermatology

## 2021-03-20 DIAGNOSIS — Z4789 Encounter for other orthopedic aftercare: Secondary | ICD-10-CM | POA: Diagnosis not present

## 2021-03-20 DIAGNOSIS — M65332 Trigger finger, left middle finger: Secondary | ICD-10-CM | POA: Diagnosis not present

## 2021-03-21 DIAGNOSIS — G4733 Obstructive sleep apnea (adult) (pediatric): Secondary | ICD-10-CM | POA: Diagnosis not present

## 2021-03-26 DIAGNOSIS — E119 Type 2 diabetes mellitus without complications: Secondary | ICD-10-CM | POA: Diagnosis not present

## 2021-03-26 DIAGNOSIS — I10 Essential (primary) hypertension: Secondary | ICD-10-CM | POA: Diagnosis not present

## 2021-03-26 DIAGNOSIS — E785 Hyperlipidemia, unspecified: Secondary | ICD-10-CM | POA: Diagnosis not present

## 2021-04-02 DIAGNOSIS — N1831 Chronic kidney disease, stage 3a: Secondary | ICD-10-CM | POA: Diagnosis not present

## 2021-04-02 DIAGNOSIS — I10 Essential (primary) hypertension: Secondary | ICD-10-CM | POA: Diagnosis not present

## 2021-04-02 DIAGNOSIS — E1165 Type 2 diabetes mellitus with hyperglycemia: Secondary | ICD-10-CM | POA: Diagnosis not present

## 2021-04-02 DIAGNOSIS — Z0000 Encounter for general adult medical examination without abnormal findings: Secondary | ICD-10-CM | POA: Diagnosis not present

## 2021-04-02 DIAGNOSIS — D649 Anemia, unspecified: Secondary | ICD-10-CM | POA: Diagnosis not present

## 2021-04-03 DIAGNOSIS — M79642 Pain in left hand: Secondary | ICD-10-CM | POA: Diagnosis not present

## 2021-04-17 DIAGNOSIS — M79642 Pain in left hand: Secondary | ICD-10-CM | POA: Diagnosis not present

## 2021-04-29 DIAGNOSIS — N1831 Chronic kidney disease, stage 3a: Secondary | ICD-10-CM | POA: Diagnosis not present

## 2021-04-29 DIAGNOSIS — D649 Anemia, unspecified: Secondary | ICD-10-CM | POA: Diagnosis not present

## 2021-05-02 DIAGNOSIS — M79642 Pain in left hand: Secondary | ICD-10-CM | POA: Diagnosis not present

## 2021-06-12 DIAGNOSIS — M25512 Pain in left shoulder: Secondary | ICD-10-CM | POA: Diagnosis not present

## 2021-06-18 DIAGNOSIS — N1831 Chronic kidney disease, stage 3a: Secondary | ICD-10-CM | POA: Diagnosis not present

## 2021-06-18 DIAGNOSIS — D649 Anemia, unspecified: Secondary | ICD-10-CM | POA: Diagnosis not present

## 2021-07-01 DIAGNOSIS — D649 Anemia, unspecified: Secondary | ICD-10-CM | POA: Diagnosis not present

## 2021-07-26 ENCOUNTER — Encounter: Payer: Self-pay | Admitting: Podiatry

## 2021-08-07 ENCOUNTER — Ambulatory Visit (INDEPENDENT_AMBULATORY_CARE_PROVIDER_SITE_OTHER): Payer: PPO

## 2021-08-07 ENCOUNTER — Encounter: Payer: Self-pay | Admitting: Podiatry

## 2021-08-07 ENCOUNTER — Ambulatory Visit: Payer: PPO | Admitting: Podiatry

## 2021-08-07 DIAGNOSIS — M778 Other enthesopathies, not elsewhere classified: Secondary | ICD-10-CM | POA: Diagnosis not present

## 2021-08-07 MED ORDER — TRIAMCINOLONE ACETONIDE 40 MG/ML IJ SUSP: 20.0000 mg | Freq: Once | INTRAMUSCULAR | Status: DC

## 2021-08-07 NOTE — Progress Notes (Signed)
She presents today chief complaint of a painful area across the dorsal aspect of the right foot.  States has been bothering me for the past 6 to 8 months and is aggravated with shoe gear primarily.  She states that she feels pretty good as long as nothing is rubbing it.  Objective: Vital signs are stable she is alert and oriented x3.  Pulses are palpable.  She has a dorsal tarsal exostosis at the level of the second metatarsal cuneiform joint.  Strong palpable pulse and pain on palpation of the deep peroneal nerve.  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  No other significant osseous abnormalities in the area other than some arthritis in the toes.  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  Plan: Discussed etiology pathology and surgical therapies we discussed appropriate shoe gear and how to pick shoes that will not rub the area.  Also we discussed strategic lacing of the shoes.  At this time also injected the area with 10 mg Kenalog 5 mg Marcaine she tolerated the procedure well and states that it felt much better prior to leaving.  I will follow-up with her on an as-needed basis.  We also did discuss that she could try Voltaren gel on occasion.

## 2021-08-14 ENCOUNTER — Ambulatory Visit: Payer: PPO | Admitting: Podiatry

## 2021-08-20 DIAGNOSIS — G4733 Obstructive sleep apnea (adult) (pediatric): Secondary | ICD-10-CM | POA: Diagnosis not present

## 2021-09-23 ENCOUNTER — Ambulatory Visit: Payer: PPO | Admitting: Podiatry

## 2021-09-23 DIAGNOSIS — E1165 Type 2 diabetes mellitus with hyperglycemia: Secondary | ICD-10-CM | POA: Diagnosis not present

## 2021-09-30 DIAGNOSIS — R300 Dysuria: Secondary | ICD-10-CM | POA: Diagnosis not present

## 2021-09-30 DIAGNOSIS — E119 Type 2 diabetes mellitus without complications: Secondary | ICD-10-CM | POA: Diagnosis not present

## 2021-09-30 DIAGNOSIS — F40243 Fear of flying: Secondary | ICD-10-CM | POA: Diagnosis not present

## 2021-09-30 DIAGNOSIS — I10 Essential (primary) hypertension: Secondary | ICD-10-CM | POA: Diagnosis not present

## 2021-09-30 DIAGNOSIS — G43909 Migraine, unspecified, not intractable, without status migrainosus: Secondary | ICD-10-CM | POA: Diagnosis not present

## 2021-10-02 DIAGNOSIS — R8290 Unspecified abnormal findings in urine: Secondary | ICD-10-CM | POA: Diagnosis not present

## 2021-10-23 ENCOUNTER — Ambulatory Visit: Payer: PPO | Admitting: Dermatology

## 2021-10-23 DIAGNOSIS — L820 Inflamed seborrheic keratosis: Secondary | ICD-10-CM

## 2021-10-23 DIAGNOSIS — M67441 Ganglion, right hand: Secondary | ICD-10-CM | POA: Diagnosis not present

## 2021-10-23 DIAGNOSIS — M67449 Ganglion, unspecified hand: Secondary | ICD-10-CM

## 2021-10-23 NOTE — Progress Notes (Signed)
   Follow-Up Visit   Subjective  Cheryl Key is a 76 y.o. female who presents for the following: check spots (R thumb, 41m tender to touch/R upper arm, not sure how long it has been there, no symptoms). The patient has spots, moles and lesions to be evaluated, some may be new or changing and the patient has concerns that these could be cancer.  The following portions of the chart were reviewed this encounter and updated as appropriate:   Tobacco  Allergies  Meds  Problems  Med Hx  Surg Hx  Fam Hx     Review of Systems:  No other skin or systemic complaints except as noted in HPI or Assessment and Plan.  Objective  Well appearing patient in no apparent distress; mood and affect are within normal limits.  A focused examination was performed including R thumb, R upper arm. Relevant physical exam findings are noted in the Assessment and Plan.  R medial bicep x 3, R thumb x 1 (4) Stuck on waxy paps with erythema      Assessment & Plan  Inflamed seborrheic keratosis (4) R medial bicep x 3, R thumb x 1  Symptomatic, irritating, patient would like treated.   Destruction of lesion - R medial bicep x 3, R thumb x 1 Complexity: simple   Destruction method: cryotherapy   Informed consent: discussed and consent obtained   Timeout:  patient name, date of birth, surgical site, and procedure verified Lesion destroyed using liquid nitrogen: Yes   Region frozen until ice ball extended beyond lesion: Yes   Outcome: patient tolerated procedure well with no complications   Post-procedure details: wound care instructions given    Digital Mucous Cyst See photo A digital mucous cyst also known as a myxoid cyst or pseudocyst is a ganglion cyst arising from the distal interphalangeal (DIP) joint of the finger or thumb (or, less commonly, toe). The cysts are believed to form from degeneration of connective tissue and are associated with osteoarthritic joints or injury. Although the exact  etiology is unknown, it is likely that a small tear forms in a joint capsule or tendon sheath, allowing extravasation of synovial fluid into the adjacent tissue. When the fluid reacts with local tissue, it becomes more gelatinous and a cyst wall forms. With any treatment, there is a high rate of recurrence.   Treatment options include: - Puncture / Incision & Drainage (I&D) - Intralesional steroid injection - Intralesional Sclerosant injection (Asclera/ Polidocanol) - Intralesional steroid + sclerosant - Corticosteroid tape - Cryosurgery - Laser (CO2) - Infrared photocoagulation - Excision / Surgery Reviewed all this today. Pt will not do anything for now unless worsens.  Return for as scheduled for TBSE, recheck ISKs.  I, SOthelia Pulling RMA, am acting as scribe for DSarina Ser MD . Documentation: I have reviewed the above documentation for accuracy and completeness, and I agree with the above.  DSarina Ser MD

## 2021-10-23 NOTE — Patient Instructions (Addendum)
Cryotherapy Aftercare  Wash gently with soap and water everyday.   Apply Vaseline and Band-Aid daily until healed.     Due to recent changes in healthcare laws, you may see results of your pathology and/or laboratory studies on MyChart before the doctors have had a chance to review them. We understand that in some cases there may be results that are confusing or concerning to you. Please understand that not all results are received at the same time and often the doctors may need to interpret multiple results in order to provide you with the best plan of care or course of treatment. Therefore, we ask that you please give us 2 business days to thoroughly review all your results before contacting the office for clarification. Should we see a critical lab result, you will be contacted sooner.   If You Need Anything After Your Visit  If you have any questions or concerns for your doctor, please call our main line at [phone] and press option 4 to reach your doctor's medical assistant. If no one answers, please leave a voicemail as directed and we will return your call as soon as possible. Messages left after 4 pm will be answered the following business day.   You may also send us a message via MyChart. We typically respond to MyChart messages within 1-2 business days.  For prescription refills, please ask your pharmacy to contact our office. Our fax number is [phone].  If you have an urgent issue when the clinic is closed that cannot wait until the next business day, you can page your doctor at the number below.    Please note that while we do our best to be available for urgent issues outside of office hours, we are not available 24/7.   If you have an urgent issue and are unable to reach us, you may choose to seek medical care at your doctor's office, retail clinic, urgent care center, or emergency room.  If you have a medical emergency, please immediately call 911 or go to the  emergency department.  Pager Numbers  - Dr. Kowalski: [phone]  - Dr. Moye: [phone]  - Dr. Stewart: [phone]  In the event of inclement weather, please call our main line at [phone] for an update on the status of any delays or closures.  Dermatology Medication Tips: Please keep the boxes that topical medications come in in order to help keep track of the instructions about where and how to use these. Pharmacies typically print the medication instructions only on the boxes and not directly on the medication tubes.   If your medication is too expensive, please contact our office at [phone] option 4 or send us a message through MyChart.   We are unable to tell what your co-pay for medications will be in advance as this is different depending on your insurance coverage. However, we may be able to find a substitute medication at lower cost or fill out paperwork to get insurance to cover a needed medication.   If a prior authorization is required to get your medication covered by your insurance company, please allow us 1-2 business days to complete this process.  Drug prices often vary depending on where the prescription is filled and some pharmacies may offer cheaper prices.  The website www.goodrx.com contains coupons for medications through different pharmacies. The prices here do not account for what the cost may be with help from insurance (it may be cheaper with your insurance), but the website can   give you the price if you did not use any insurance.  - You can print the associated coupon and take it with your prescription to the pharmacy.  - You may also stop by our office during regular business hours and pick up a GoodRx coupon card.  - If you need your prescription sent electronically to a different pharmacy, notify our office through [place] MyChart or by phone at [phone] option 4.     Si Usted Necesita Algo Despus de Su Visita  Tambin puede  enviarnos un mensaje a travs de MyChart. Por lo general respondemos a los mensajes de MyChart en el transcurso de 1 a 2 das hbiles.  Para renovar recetas, por favor pida a su farmacia que se ponga en contacto con nuestra oficina. Nuestro nmero de fax es el [phone].  Si tiene un asunto urgente cuando la clnica est cerrada y que no puede esperar hasta el siguiente da hbil, puede llamar/localizar a su doctor(a) al nmero que aparece a continuacin.   Por favor, tenga en cuenta que aunque hacemos todo lo posible para estar disponibles para asuntos urgentes fuera del horario de oficina, no estamos disponibles las 24 horas del da, los 7 das de la semana.   Si tiene un problema urgente y no puede comunicarse con nosotros, puede optar por buscar atencin mdica  en el consultorio de su doctor(a), en una clnica privada, en un centro de atencin urgente o en una sala de emergencias.  Si tiene una emergencia mdica, por favor llame inmediatamente al 911 o vaya a la sala de emergencias.  Nmeros de bper  - Dr. Kowalski: [phone]  - Dra. Moye: [phone]  - Dra. Stewart: [phone]  En caso de inclemencias del tiempo, por favor llame a nuestra lnea principal al [phone] para una actualizacin sobre el estado de cualquier retraso o cierre.  Consejos para la medicacin en dermatologa: Por favor, guarde las cajas en las que vienen los medicamentos de uso tpico para ayudarle a seguir las instrucciones sobre dnde y cmo usarlos. Las farmacias generalmente imprimen las instrucciones del medicamento slo en las cajas y no directamente en los tubos del medicamento.   Si su medicamento es muy caro, por favor, pngase en contacto con nuestra oficina llamando al [phone] y presione la opcin 4 o envenos un mensaje a travs de MyChart.   No podemos decirle cul ser su copago por los medicamentos por adelantado ya que esto es diferente dependiendo de la cobertura de su seguro.  Sin embargo, es posible que podamos encontrar un medicamento sustituto a menor costo o llenar un formulario para que el seguro cubra el medicamento que se considera necesario.   Si se requiere una autorizacin previa para que su compaa de seguros cubra su medicamento, por favor permtanos de 1 a 2 das hbiles para completar este proceso.  Los precios de los medicamentos varan con frecuencia dependiendo del lugar de dnde se surte la receta y alguna farmacias pueden ofrecer precios ms baratos.  El sitio web www.goodrx.com tiene cupones para medicamentos de diferentes farmacias. Los precios aqu no tienen en cuenta lo que podra costar con la ayuda del seguro (puede ser ms barato con su seguro), pero el sitio web puede darle el precio si no utiliz ningn seguro.  - Puede imprimir el cupn correspondiente y llevarlo con su receta a la farmacia.  - Tambin puede pasar por nuestra oficina durante el horario de atencin regular y recoger una tarjeta de cupones de GoodRx.  -   Si necesita que su receta se enve electrnicamente a una farmacia diferente, informe a nuestra oficina a travs de MyChart de [place] o por telfono llamando al [phone] y presione la opcin 4.  

## 2021-10-29 ENCOUNTER — Encounter: Payer: Self-pay | Admitting: Dermatology

## 2021-11-22 DIAGNOSIS — G4733 Obstructive sleep apnea (adult) (pediatric): Secondary | ICD-10-CM | POA: Diagnosis not present

## 2022-01-28 DIAGNOSIS — R059 Cough, unspecified: Secondary | ICD-10-CM | POA: Diagnosis not present

## 2022-01-28 DIAGNOSIS — R0781 Pleurodynia: Secondary | ICD-10-CM | POA: Diagnosis not present

## 2022-01-28 DIAGNOSIS — S299XXA Unspecified injury of thorax, initial encounter: Secondary | ICD-10-CM | POA: Diagnosis not present

## 2022-02-04 ENCOUNTER — Ambulatory Visit: Payer: PPO | Admitting: Podiatry

## 2022-02-04 ENCOUNTER — Ambulatory Visit (INDEPENDENT_AMBULATORY_CARE_PROVIDER_SITE_OTHER): Payer: PPO

## 2022-02-04 DIAGNOSIS — M778 Other enthesopathies, not elsewhere classified: Secondary | ICD-10-CM

## 2022-02-04 DIAGNOSIS — S93601A Unspecified sprain of right foot, initial encounter: Secondary | ICD-10-CM

## 2022-02-04 NOTE — Progress Notes (Signed)
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  She states that I really do not know exactly what I did to my right foot but I know that it hurts on the outside.  Objective: Vital signs are stable alert oriented x 3.  Pulses are palpable.  There is no erythema some mild edema no cellulitis drainage noted no ecchymosis is noted.  She has tenderness on palpation of the sinus tarsi tenderness on palpation of the fourth fifth tarsometatarsal joint and the third tarsometatarsal joint.  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  Assessment: Sprain foot subtalar joint right.  Plan: Placed in a Tri-Lock brace discussed appropriate shoe gear recommended that she try rest ice compression elevation therapy.  Follow-up with her should this not resolve in the next few weeks

## 2022-02-07 ENCOUNTER — Other Ambulatory Visit: Payer: Self-pay | Admitting: Podiatry

## 2022-02-07 DIAGNOSIS — M778 Other enthesopathies, not elsewhere classified: Secondary | ICD-10-CM

## 2022-02-07 DIAGNOSIS — S93601A Unspecified sprain of right foot, initial encounter: Secondary | ICD-10-CM

## 2022-02-24 DIAGNOSIS — R1013 Epigastric pain: Secondary | ICD-10-CM | POA: Diagnosis not present

## 2022-02-24 DIAGNOSIS — E1165 Type 2 diabetes mellitus with hyperglycemia: Secondary | ICD-10-CM | POA: Diagnosis not present

## 2022-02-24 DIAGNOSIS — N1831 Chronic kidney disease, stage 3a: Secondary | ICD-10-CM | POA: Diagnosis not present

## 2022-02-24 DIAGNOSIS — I10 Essential (primary) hypertension: Secondary | ICD-10-CM | POA: Diagnosis not present

## 2022-02-24 DIAGNOSIS — R1310 Dysphagia, unspecified: Secondary | ICD-10-CM | POA: Diagnosis not present

## 2022-02-25 ENCOUNTER — Other Ambulatory Visit (HOSPITAL_COMMUNITY): Payer: Self-pay | Admitting: Student

## 2022-02-25 ENCOUNTER — Emergency Department: Payer: PPO

## 2022-02-25 ENCOUNTER — Encounter: Payer: Self-pay | Admitting: Emergency Medicine

## 2022-02-25 ENCOUNTER — Other Ambulatory Visit: Payer: Self-pay

## 2022-02-25 ENCOUNTER — Emergency Department
Admission: EM | Admit: 2022-02-25 | Discharge: 2022-02-25 | Disposition: A | Discharge status: Home / Self Care | Payer: PPO | Attending: Student in an Organized Health Care Education/Training Program | Admitting: Student in an Organized Health Care Education/Training Program

## 2022-02-25 DIAGNOSIS — R1310 Dysphagia, unspecified: Secondary | ICD-10-CM | POA: Insufficient documentation

## 2022-02-25 DIAGNOSIS — R0789 Other chest pain: Secondary | ICD-10-CM | POA: Diagnosis not present

## 2022-02-25 DIAGNOSIS — K209 Esophagitis, unspecified without bleeding: Secondary | ICD-10-CM

## 2022-02-25 DIAGNOSIS — R1013 Epigastric pain: Secondary | ICD-10-CM

## 2022-02-25 DIAGNOSIS — R079 Chest pain, unspecified: Secondary | ICD-10-CM | POA: Diagnosis not present

## 2022-02-25 LAB — CBC WITH DIFFERENTIAL/PLATELET
Abs Immature Granulocytes: 0.02 10*3/uL (ref 0.00–0.07)
Basophils Absolute: 0 10*3/uL (ref 0.0–0.1)
Basophils Relative: 0 %
Eosinophils Absolute: 0.1 10*3/uL (ref 0.0–0.5)
Eosinophils Relative: 1 %
HCT: 37 % (ref 36.0–46.0)
Hemoglobin: 12.4 g/dL (ref 12.0–15.0)
Immature Granulocytes: 0 %
Lymphocytes Relative: 33 %
Lymphs Abs: 2.7 10*3/uL (ref 0.7–4.0)
MCH: 27.5 pg (ref 26.0–34.0)
MCHC: 33.5 g/dL (ref 30.0–36.0)
MCV: 82 fL (ref 80.0–100.0)
Monocytes Absolute: 0.7 10*3/uL (ref 0.1–1.0)
Monocytes Relative: 8 %
Neutro Abs: 4.9 10*3/uL (ref 1.7–7.7)
Neutrophils Relative %: 58 %
Platelets: 251 10*3/uL (ref 150–400)
RBC: 4.51 MIL/uL (ref 3.87–5.11)
RDW: 13 % (ref 11.5–15.5)
WBC: 8.4 10*3/uL (ref 4.0–10.5)
nRBC: 0 % (ref 0.0–0.2)

## 2022-02-25 LAB — COMPREHENSIVE METABOLIC PANEL
ALT: 17 U/L (ref 0–44)
AST: 23 U/L (ref 15–41)
Albumin: 4.1 g/dL (ref 3.5–5.0)
Alkaline Phosphatase: 61 U/L (ref 38–126)
Anion gap: 9 (ref 5–15)
BUN: 20 mg/dL (ref 8–23)
CO2: 27 mmol/L (ref 22–32)
Calcium: 9.3 mg/dL (ref 8.9–10.3)
Chloride: 99 mmol/L (ref 98–111)
Creatinine, Ser: 1.14 mg/dL — ABNORMAL HIGH (ref 0.44–1.00)
GFR, Estimated: 51 mL/min — ABNORMAL LOW (ref >60)
Glucose, Bld: 109 mg/dL — ABNORMAL HIGH (ref 70–99)
Potassium: 3.6 mmol/L (ref 3.5–5.1)
Sodium: 135 mmol/L (ref 135–145)
Total Bilirubin: 1 mg/dL (ref 0.3–1.2)
Total Protein: 7.7 g/dL (ref 6.5–8.1)

## 2022-02-25 LAB — TROPONIN I (HIGH SENSITIVITY): Troponin I (High Sensitivity): 9 ng/L (ref <18)

## 2022-02-25 MED ORDER — PANTOPRAZOLE SODIUM 40 MG PO TBEC: 40.0000 mg | DELAYED_RELEASE_TABLET | Freq: Every day | ORAL | 1 refills | Status: DC

## 2022-02-25 MED ORDER — SUCRALFATE 1 GM/10ML PO SUSP: 1.0000 g | Freq: Four times a day (QID) | ORAL | 0 refills | Status: AC

## 2022-02-25 MED ORDER — PANTOPRAZOLE SODIUM 40 MG PO TBEC: 40.0000 mg | DELAYED_RELEASE_TABLET | Freq: Two times a day (BID) | ORAL | 0 refills | Status: AC

## 2022-02-25 MED ORDER — SUCRALFATE 1 G PO TABS
1.0000 g | ORAL_TABLET | Freq: Once | ORAL | Status: AC
  Administered 2022-02-25: 1 g via ORAL
  Filled 2022-02-25: qty 1

## 2022-02-25 MED ORDER — ALUM & MAG HYDROXIDE-SIMETH 200-200-20 MG/5ML PO SUSP
30.0000 mL | Freq: Once | ORAL | Status: AC
  Administered 2022-02-25: 30 mL via ORAL
  Filled 2022-02-25: qty 30

## 2022-02-25 NOTE — ED Notes (Signed)
See triage note  Presents with some mid sternal chest discomfort   Sx's started last Thursday  Pain increases after eating

## 2022-02-25 NOTE — ED Provider Notes (Signed)
[facility] Provider Note    Event Date/Time   First MD Initiated Contact with Patient 02/25/22 1355     (approximate)   History   Chest Pain   HPI  Cheryl Key is a 76 y.o. female who presents to the ER for evaluation of trouble swallowing and chest discomfort.  States that she been having symptoms for a few days and tried taking one of her pills for the weekend and felt like it got stuck and caused some irritation.  Since then she has been having pain with swallowing even liquids.  She is able to get liquids to pass.  She tried some Nexium without much relief.  She followed up with her PCP yesterday and was given referral to GI and outpatient esophagram but these were not able to be performed for quite some time she was having worsening pain today so she came to the ER.     Physical Exam   Triage Vital Signs: ED Triage Vitals  Enc Vitals Group     BP 02/25/22 1156 (!) 151/108     Pulse Rate 02/25/22 1156 98     Resp 02/25/22 1156 17     Temp 02/25/22 1156 97.8 F (36.6 C)     Temp Source 02/25/22 1156 Oral     SpO2 02/25/22 1156 100 %     Weight 02/25/22 1157 230 lb (104.3 kg)     Height 02/25/22 1157 '5\' 4"'$  (1.626 m)     Head Circumference --      Peak Flow --      Pain Score 02/25/22 1157 6     Pain Loc --      Pain Edu? --      Excl. in [place]? --     Most recent vital signs: Vitals:   02/25/22 1156  BP: (!) 151/108  Pulse: 98  Resp: 17  Temp: 97.8 F (36.6 C)  SpO2: 100%     Constitutional: Alert  Eyes: Conjunctivae are normal.  Head: Atraumatic. Nose: No congestion/rhinnorhea. Mouth/Throat: Mucous membranes are moist.   Neck: Painless ROM.  Cardiovascular:   Good peripheral circulation. Respiratory: Normal respiratory effort.  No retractions.  Gastrointestinal: Soft and nontender.  Musculoskeletal:  no deformity Neurologic:  MAE spontaneously. No gross focal neurologic deficits are appreciated.  Skin:  Skin is warm, dry  and intact. No rash noted. Psychiatric: Mood and affect are normal. Speech and behavior are normal.    ED Results / Procedures / Treatments   Labs (all labs ordered are listed, but only abnormal results are displayed) Labs Reviewed  COMPREHENSIVE METABOLIC PANEL - Abnormal; Notable for the following components:      Result Value   Glucose, Bld 109 (*)    Creatinine, Ser 1.14 (*)    GFR, Estimated 51 (*)    All other components within normal limits  CBC WITH DIFFERENTIAL/PLATELET  TROPONIN I (HIGH SENSITIVITY)  TROPONIN I (HIGH SENSITIVITY)     EKG  ED ECG REPORT I, Merlyn Lot, the attending physician, personally viewed and interpreted this ECG.   Date: 02/25/2022  EKG Time: 12:05  Rate: 85  Rhythm: sinus  Axis: normal  Intervals: normal  ST&T Change: no stemi, no depressions    RADIOLOGY Please see ED Course for my review and interpretation.  I personally reviewed all radiographic images ordered to evaluate for the above acute complaints and reviewed radiology reports and findings.  These findings were personally discussed with the patient.  Please see medical record for radiology report.    PROCEDURES:  Critical Care performed:   Procedures   MEDICATIONS ORDERED IN ED: Medications - No data to display   IMPRESSION / MDM / [place] / ED COURSE  I reviewed the triage vital signs and the nursing notes.                              Differential diagnosis includes, but is not limited to, esophagitis, surgical stricture, neoplasm, ACS, pneumonia, CHF  Patient presenting to the ER for evaluation of symptoms as described above.  Based on symptoms, risk factors and considered above differential, this presenting complaint could reflect a potentially life-threatening illness therefore the patient will be placed on continuous pulse oximetry and telemetry for monitoring.  Laboratory evaluation will be sent to evaluate for the above complaints.   Presentation does not seem consistent with cardiac etiology or EKG is negative.  Troponin negative.  Blood work is reassuring.  Will order esophagram is more concern for possible esophageal pathology esophagitis.  Doubt perforation given well appearance, duration of symptoms lack of white count or fever.  Discussed case in consultation with radiology who agrees to perform esophagram.  Patient be signed out to oncoming physician pending follow-up radiographic study.  Anticipate patient will be appropriate for outpatient follow-up    FINAL CLINICAL IMPRESSION(S) / ED DIAGNOSES   Final diagnoses:  Pain with swallowing     Rx / DC Orders   ED Discharge Orders     None        Note:  This document was prepared using Dragon voice recognition software and may include unintentional dictation errors.    Merlyn Lot, MD 02/25/22 1416

## 2022-02-25 NOTE — ED Triage Notes (Signed)
Pt states this past Thursday she started to have mid-sternal chest pain and also hurt when swallowing. Pt states pain is worse when eating and drinking. Pt states she was seen by PCP yesterday, but the pain has not improved so coming to the ED.  Pt states getting dizzy from the pain and almost passed out.

## 2022-02-25 NOTE — ED Provider Notes (Signed)
76 year old female here with difficulty swallowing.  Plan follow-up barium study.  Barium swallow study reviewed by me and is concerning for pill esophagitis.  There is no evidence of complication.  No evidence of perforation.  Patient is otherwise well-appearing.  Discussed the case with Dr. Marius Ditch who can see the patient for more urgent follow-up.  She recommends increasing her Protonix twice a day with Carafate.  This discussed with the patient is in agreement.  She is tolerating secretions otherwise well-appearing.   Duffy Bruce, MD 02/25/22 [phone]

## 2022-02-25 NOTE — ED Provider Triage Note (Signed)
  Emergency Medicine Provider Triage Evaluation Note  Cheryl Key , a 76 y.o.female,  was evaluated in triage.  Pt complains of chest pain/esophageal discomfort.  Patient states that she has been having this pain for the past few weeks.  Reports significant pain whenever she follows anything.  She states that the pain is substernal, but also does tend to radiate to the left and right side of her chest periodically as well.  She has been taking Nexium with little relief.   Review of Systems  Positive: Chest pain, substernal pain Negative: Denies fever, back pain, vomiting  Physical Exam  There were no vitals filed for this visit. Gen:   Awake, no distress   Resp:  Normal effort  MSK:   Moves extremities without difficulty  Other:    Medical Decision Making  Given the patient's initial medical screening exam, the following diagnostic evaluation has been ordered. The patient will be placed in the appropriate treatment space, once one is available, to complete the evaluation and treatment. I have discussed the plan of care with the patient and I have advised the patient that an ED physician or mid-level practitioner will reevaluate their condition after the test results have been received, as the results may give them additional insight into the type of treatment they may need.    Diagnostics: Labs, EKG, CXR  Treatments: none immediately   Teodoro Spray, [place] 02/25/22 1139

## 2022-02-25 NOTE — Discharge Instructions (Addendum)
STOP the nexium and take the protonix we prescribed  Take the carafate (if the pharmacy does not have the suspension they can use the tablets)

## 2022-02-26 ENCOUNTER — Other Ambulatory Visit: Payer: Self-pay

## 2022-02-26 ENCOUNTER — Telehealth: Payer: Self-pay

## 2022-02-26 ENCOUNTER — Ambulatory Visit (INDEPENDENT_AMBULATORY_CARE_PROVIDER_SITE_OTHER): Payer: PPO | Admitting: Dermatology

## 2022-02-26 VITALS — BP 142/79 | HR 89

## 2022-02-26 DIAGNOSIS — L820 Inflamed seborrheic keratosis: Secondary | ICD-10-CM

## 2022-02-26 DIAGNOSIS — D239 Other benign neoplasm of skin, unspecified: Secondary | ICD-10-CM

## 2022-02-26 DIAGNOSIS — L578 Other skin changes due to chronic exposure to nonionizing radiation: Secondary | ICD-10-CM | POA: Diagnosis not present

## 2022-02-26 DIAGNOSIS — L821 Other seborrheic keratosis: Secondary | ICD-10-CM

## 2022-02-26 DIAGNOSIS — D229 Melanocytic nevi, unspecified: Secondary | ICD-10-CM | POA: Diagnosis not present

## 2022-02-26 DIAGNOSIS — Z1283 Encounter for screening for malignant neoplasm of skin: Secondary | ICD-10-CM

## 2022-02-26 DIAGNOSIS — D235 Other benign neoplasm of skin of trunk: Secondary | ICD-10-CM

## 2022-02-26 DIAGNOSIS — L814 Other melanin hyperpigmentation: Secondary | ICD-10-CM | POA: Diagnosis not present

## 2022-02-26 DIAGNOSIS — R1319 Other dysphagia: Secondary | ICD-10-CM

## 2022-02-26 NOTE — Progress Notes (Signed)
Follow-Up Visit   Subjective  Cheryl Key is a 76 y.o. female who presents for the following: Annual Exam (History of BCC and Dysplastic nevi - The patient presents for Total-Body Skin Exam (TBSE) for skin cancer screening and mole check.  The patient has spots, moles and lesions to be evaluated, some may be new or changing and the patient has concerns that these could be cancer./).  The following portions of the chart were reviewed this encounter and updated as appropriate:   Tobacco  Allergies  Meds  Problems  Med Hx  Surg Hx  Fam Hx     Review of Systems:  No other skin or systemic complaints except as noted in HPI or Assessment and Plan.  Objective  Well appearing patient in no apparent distress; mood and affect are within normal limits.  A full examination was performed including scalp, head, eyes, ears, nose, lips, neck, chest, axillae, abdomen, back, buttocks, bilateral upper extremities, bilateral lower extremities, hands, feet, fingers, toes, fingernails, and toenails. All findings within normal limits unless otherwise noted below.  Right sup forehead at hairline x 1, right forearm x 1 (2) Erythematous stuck-on, waxy papule or plaque  Right Breast Firm pink/brown papulenodule with dimple sign.    Assessment & Plan   Acrochordons (Skin Tags) - Fleshy, skin-colored pedunculated papules - Benign appearing.  - Observe. - If desired, they can be removed with an in office procedure that is not covered by insurance. - Please call the clinic if you notice any new or changing lesions.  Lentigines - Scattered tan macules - Due to sun exposure - Benign-appearing, observe - Recommend daily broad spectrum sunscreen SPF 30+ to sun-exposed areas, reapply every 2 hours as needed. - Call for any changes  Seborrheic Keratoses - Stuck-on, waxy, tan-brown papules and/or plaques  - Benign-appearing - Discussed benign etiology and prognosis. - Observe - Call for any  changes  Melanocytic Nevi - Tan-brown and/or pink-flesh-colored symmetric macules and papules - Benign appearing on exam today - Observation - Call clinic for new or changing moles - Recommend daily use of broad spectrum spf 30+ sunscreen to sun-exposed areas.   Hemangiomas - Red papules - Discussed benign nature - Observe - Call for any changes  Actinic Damage - Chronic condition, secondary to cumulative UV/sun exposure - diffuse scaly erythematous macules with underlying dyspigmentation - Recommend daily broad spectrum sunscreen SPF 30+ to sun-exposed areas, reapply every 2 hours as needed.  - Staying in the shade or wearing long sleeves, sun glasses (UVA+UVB protection) and wide brim hats (4-inch brim around the entire circumference of the hat) are also recommended for sun protection.  - Call for new or changing lesions.  Skin cancer screening performed today.  Inflamed seborrheic keratosis (2) Right sup forehead at hairline x 1, right forearm x 1 Destruction of lesion - Right sup forehead at hairline x 1, right forearm x 1 Complexity: simple   Destruction method: cryotherapy   Informed consent: discussed and consent obtained   Timeout:  patient name, date of birth, surgical site, and procedure verified Lesion destroyed using liquid nitrogen: Yes   Region frozen until ice ball extended beyond lesion: Yes   Outcome: patient tolerated procedure well with no complications   Post-procedure details: wound care instructions given    Dermatofibroma Right Breast Benign-appearing.  Observation.  Call clinic for new or changing lesions.  Recommend daily use of broad spectrum spf 30+ sunscreen to sun-exposed areas.   Return in about 1  year (around 02/27/2023) for TBSE.  I, Ashok Cordia, CMA, am acting as scribe for Sarina Ser, MD . Documentation: I have reviewed the above documentation for accuracy and completeness, and I agree with the above.  Sarina Ser, MD

## 2022-02-26 NOTE — Telephone Encounter (Signed)
Patient schedule EGD for 03/05/2022. Went over instructions with patient and sent to mychart. Schedule follow up appointment

## 2022-02-26 NOTE — Patient Instructions (Signed)
Cryotherapy Aftercare  Wash gently with soap and water everyday.   Apply Vaseline and Band-Aid daily until healed.     Due to recent changes in healthcare laws, you may see results of your pathology and/or laboratory studies on MyChart before the doctors have had a chance to review them. We understand that in some cases there may be results that are confusing or concerning to you. Please understand that not all results are received at the same time and often the doctors may need to interpret multiple results in order to provide you with the best plan of care or course of treatment. Therefore, we ask that you please give us 2 business days to thoroughly review all your results before contacting the office for clarification. Should we see a critical lab result, you will be contacted sooner.   If You Need Anything After Your Visit  If you have any questions or concerns for your doctor, please call our main line at [phone] and press option 4 to reach your doctor's medical assistant. If no one answers, please leave a voicemail as directed and we will return your call as soon as possible. Messages left after 4 pm will be answered the following business day.   You may also send us a message via MyChart. We typically respond to MyChart messages within 1-2 business days.  For prescription refills, please ask your pharmacy to contact our office. Our fax number is [phone].  If you have an urgent issue when the clinic is closed that cannot wait until the next business day, you can page your doctor at the number below.    Please note that while we do our best to be available for urgent issues outside of office hours, we are not available 24/7.   If you have an urgent issue and are unable to reach us, you may choose to seek medical care at your doctor's office, retail clinic, urgent care center, or emergency room.  If you have a medical emergency, please immediately call 911 or go to the  emergency department.  Pager Numbers  - Dr. Kowalski: [phone]  - Dr. Moye: [phone]  - Dr. Stewart: [phone]  In the event of inclement weather, please call our main line at [phone] for an update on the status of any delays or closures.  Dermatology Medication Tips: Please keep the boxes that topical medications come in in order to help keep track of the instructions about where and how to use these. Pharmacies typically print the medication instructions only on the boxes and not directly on the medication tubes.   If your medication is too expensive, please contact our office at [phone] option 4 or send us a message through MyChart.   We are unable to tell what your co-pay for medications will be in advance as this is different depending on your insurance coverage. However, we may be able to find a substitute medication at lower cost or fill out paperwork to get insurance to cover a needed medication.   If a prior authorization is required to get your medication covered by your insurance company, please allow us 1-2 business days to complete this process.  Drug prices often vary depending on where the prescription is filled and some pharmacies may offer cheaper prices.  The website www.goodrx.com contains coupons for medications through different pharmacies. The prices here do not account for what the cost may be with help from insurance (it may be cheaper with your insurance), but the website can   give you the price if you did not use any insurance.  - You can print the associated coupon and take it with your prescription to the pharmacy.  - You may also stop by our office during regular business hours and pick up a GoodRx coupon card.  - If you need your prescription sent electronically to a different pharmacy, notify our office through [place] MyChart or by phone at [phone] option 4.     Si Usted Necesita Algo Despus de Su Visita  Tambin puede  enviarnos un mensaje a travs de MyChart. Por lo general respondemos a los mensajes de MyChart en el transcurso de 1 a 2 das hbiles.  Para renovar recetas, por favor pida a su farmacia que se ponga en contacto con nuestra oficina. Nuestro nmero de fax es el [phone].  Si tiene un asunto urgente cuando la clnica est cerrada y que no puede esperar hasta el siguiente da hbil, puede llamar/localizar a su doctor(a) al nmero que aparece a continuacin.   Por favor, tenga en cuenta que aunque hacemos todo lo posible para estar disponibles para asuntos urgentes fuera del horario de oficina, no estamos disponibles las 24 horas del da, los 7 das de la semana.   Si tiene un problema urgente y no puede comunicarse con nosotros, puede optar por buscar atencin mdica  en el consultorio de su doctor(a), en una clnica privada, en un centro de atencin urgente o en una sala de emergencias.  Si tiene una emergencia mdica, por favor llame inmediatamente al 911 o vaya a la sala de emergencias.  Nmeros de bper  - Dr. Kowalski: [phone]  - Dra. Moye: [phone]  - Dra. Stewart: [phone]  En caso de inclemencias del tiempo, por favor llame a nuestra lnea principal al [phone] para una actualizacin sobre el estado de cualquier retraso o cierre.  Consejos para la medicacin en dermatologa: Por favor, guarde las cajas en las que vienen los medicamentos de uso tpico para ayudarle a seguir las instrucciones sobre dnde y cmo usarlos. Las farmacias generalmente imprimen las instrucciones del medicamento slo en las cajas y no directamente en los tubos del medicamento.   Si su medicamento es muy caro, por favor, pngase en contacto con nuestra oficina llamando al [phone] y presione la opcin 4 o envenos un mensaje a travs de MyChart.   No podemos decirle cul ser su copago por los medicamentos por adelantado ya que esto es diferente dependiendo de la cobertura de su seguro.  Sin embargo, es posible que podamos encontrar un medicamento sustituto a menor costo o llenar un formulario para que el seguro cubra el medicamento que se considera necesario.   Si se requiere una autorizacin previa para que su compaa de seguros cubra su medicamento, por favor permtanos de 1 a 2 das hbiles para completar este proceso.  Los precios de los medicamentos varan con frecuencia dependiendo del lugar de dnde se surte la receta y alguna farmacias pueden ofrecer precios ms baratos.  El sitio web www.goodrx.com tiene cupones para medicamentos de diferentes farmacias. Los precios aqu no tienen en cuenta lo que podra costar con la ayuda del seguro (puede ser ms barato con su seguro), pero el sitio web puede darle el precio si no utiliz ningn seguro.  - Puede imprimir el cupn correspondiente y llevarlo con su receta a la farmacia.  - Tambin puede pasar por nuestra oficina durante el horario de atencin regular y recoger una tarjeta de cupones de GoodRx.  -   Si necesita que su receta se enve electrnicamente a una farmacia diferente, informe a nuestra oficina a travs de MyChart de  o por telfono llamando al [phone] y presione la opcin 4.  

## 2022-02-26 NOTE — Telephone Encounter (Signed)
-----   Message from Lin Landsman, MD sent at 02/25/2022  5:14 PM EST ----- Regarding: EGD Caryl Pina  This patient is referred from the ER for dysphagia.  Please call the patient and schedule upper endoscopy for dysphagia if she is agreeable.  And, I can see her in the office after EGD  RV

## 2022-02-28 ENCOUNTER — Encounter: Payer: Self-pay | Admitting: Gastroenterology

## 2022-03-04 DIAGNOSIS — G4733 Obstructive sleep apnea (adult) (pediatric): Secondary | ICD-10-CM | POA: Diagnosis not present

## 2022-03-05 ENCOUNTER — Ambulatory Visit: Payer: PPO | Admitting: General Practice

## 2022-03-05 ENCOUNTER — Ambulatory Visit: Payer: PPO

## 2022-03-05 ENCOUNTER — Ambulatory Visit
Admission: RE | Admit: 2022-03-05 | Discharge: 2022-03-05 | Disposition: A | Discharge status: Home / Self Care | Payer: PPO | Attending: Gastroenterology | Admitting: Gastroenterology

## 2022-03-05 ENCOUNTER — Encounter: Payer: Self-pay | Admitting: Gastroenterology

## 2022-03-05 ENCOUNTER — Encounter
Admission: RE | Disposition: A | Discharge status: Home / Self Care | Payer: Self-pay | Source: Home / Self Care | Attending: Gastroenterology

## 2022-03-05 DIAGNOSIS — K2950 Unspecified chronic gastritis without bleeding: Secondary | ICD-10-CM | POA: Diagnosis not present

## 2022-03-05 DIAGNOSIS — K319 Disease of stomach and duodenum, unspecified: Secondary | ICD-10-CM

## 2022-03-05 DIAGNOSIS — Z85828 Personal history of other malignant neoplasm of skin: Secondary | ICD-10-CM | POA: Diagnosis not present

## 2022-03-05 DIAGNOSIS — K3189 Other diseases of stomach and duodenum: Secondary | ICD-10-CM | POA: Diagnosis not present

## 2022-03-05 DIAGNOSIS — R1319 Other dysphagia: Secondary | ICD-10-CM | POA: Diagnosis not present

## 2022-03-05 DIAGNOSIS — I10 Essential (primary) hypertension: Secondary | ICD-10-CM | POA: Diagnosis not present

## 2022-03-05 DIAGNOSIS — R1314 Dysphagia, pharyngoesophageal phase: Secondary | ICD-10-CM | POA: Insufficient documentation

## 2022-03-05 DIAGNOSIS — K317 Polyp of stomach and duodenum: Secondary | ICD-10-CM | POA: Diagnosis not present

## 2022-03-05 DIAGNOSIS — E119 Type 2 diabetes mellitus without complications: Secondary | ICD-10-CM | POA: Diagnosis not present

## 2022-03-05 DIAGNOSIS — K2289 Other specified disease of esophagus: Secondary | ICD-10-CM | POA: Diagnosis not present

## 2022-03-05 DIAGNOSIS — K229 Disease of esophagus, unspecified: Secondary | ICD-10-CM | POA: Diagnosis not present

## 2022-03-05 DIAGNOSIS — E785 Hyperlipidemia, unspecified: Secondary | ICD-10-CM | POA: Insufficient documentation

## 2022-03-05 DIAGNOSIS — K219 Gastro-esophageal reflux disease without esophagitis: Secondary | ICD-10-CM | POA: Diagnosis not present

## 2022-03-05 DIAGNOSIS — Z79899 Other long term (current) drug therapy: Secondary | ICD-10-CM | POA: Diagnosis not present

## 2022-03-05 DIAGNOSIS — Z7984 Long term (current) use of oral hypoglycemic drugs: Secondary | ICD-10-CM | POA: Diagnosis not present

## 2022-03-05 DIAGNOSIS — K2210 Ulcer of esophagus without bleeding: Secondary | ICD-10-CM | POA: Diagnosis not present

## 2022-03-05 HISTORY — PX: ESOPHAGOGASTRODUODENOSCOPY (EGD) WITH PROPOFOL: SHX5813

## 2022-03-05 SURGERY — ESOPHAGOGASTRODUODENOSCOPY (EGD) WITH PROPOFOL
Anesthesia: General

## 2022-03-05 MED ORDER — GLYCOPYRROLATE 0.2 MG/ML IJ SOLN
INTRAMUSCULAR | Status: AC
  Filled 2022-03-05: qty 1

## 2022-03-05 MED ORDER — GLYCOPYRROLATE 0.2 MG/ML IJ SOLN
INTRAMUSCULAR | Status: DC | PRN
  Administered 2022-03-05: .2 mg via INTRAVENOUS

## 2022-03-05 MED ORDER — DEXMEDETOMIDINE HCL IN NACL 80 MCG/20ML IV SOLN
INTRAVENOUS | Status: AC
  Filled 2022-03-05: qty 20

## 2022-03-05 MED ORDER — DEXMEDETOMIDINE HCL IN NACL 200 MCG/50ML IV SOLN
INTRAVENOUS | Status: DC | PRN
  Administered 2022-03-05: 8 ug via INTRAVENOUS

## 2022-03-05 MED ORDER — PROPOFOL 1000 MG/100ML IV EMUL
INTRAVENOUS | Status: AC
  Filled 2022-03-05: qty 100

## 2022-03-05 MED ORDER — SODIUM CHLORIDE 0.9 % IV SOLN: INTRAVENOUS | Status: DC

## 2022-03-05 MED ORDER — PROPOFOL 500 MG/50ML IV EMUL
INTRAVENOUS | Status: DC | PRN
  Administered 2022-03-05: 150 ug/kg/min via INTRAVENOUS

## 2022-03-05 MED ORDER — PROPOFOL 10 MG/ML IV BOLUS
INTRAVENOUS | Status: DC | PRN
  Administered 2022-03-05: 80 mg via INTRAVENOUS

## 2022-03-05 NOTE — H&P (Signed)
Cephas Darby, MD [address]  [place]  [place], [place] [address]  Main: [phone]  Fax: [phone] Pager: [phone]  Primary Care Physician:  [place] Pink, MD Primary Gastroenterologist:  Dr. Cephas Darby  Pre-Procedure History & Physical: HPI:  Cheryl Key is a 76 y.o. female is here for an endoscopy.   Past Medical History:  Diagnosis Date   Basal cell carcinoma 02/22/2020   right lower leg superficial    Cancer (HCC)    basal cell skin cancer   Carpal tunnel syndrome    Gastritis    GERD (gastroesophageal reflux disease)    History of hepatitis A    Hx of basal cell carcinoma 02/16/2019   Left upper back sup. med. scapula. Infiltrative. Excised 04/12/2019   Hx of dysplastic nevus 01/07/2007   Right pretibial    Hyperlipidemia 10/17/2020   Melanocytic nevus 02/21/2019   right neck   Migraines    Other and unspecified angina pectoris    Unspecified essential hypertension     Past Surgical History:  Procedure Laterality Date   basel cell removal     CHOLECYSTECTOMY     colonoscopy     COLONOSCOPY WITH PROPOFOL N/A 03/30/2017   Procedure: COLONOSCOPY WITH PROPOFOL;  Surgeon: Manya Silvas, MD;  Location: [facility] ENDOSCOPY;  Service: Endoscopy;  Laterality: N/A;   ESOPHAGOGASTRODUODENOSCOPY (EGD) WITH PROPOFOL N/A 03/30/2017   Procedure: ESOPHAGOGASTRODUODENOSCOPY (EGD) WITH PROPOFOL;  Surgeon: Manya Silvas, MD;  Location: [facility] ENDOSCOPY;  Service: Endoscopy;  Laterality: N/A;   FOOT SURGERY     x2   FRACTURE SURGERY     GALLBLADDER SURGERY     ORIF SHOULDER FRACTURE     UPPER GI ENDOSCOPY     VAGINAL HYSTERECTOMY  03/10/1984   endometriosis    Prior to Admission medications   Medication Sig Start Date End Date Taking? Authorizing Provider  calcium-vitamin D (OSCAL WITH D) 500-200 MG-UNIT tablet Take 1 tablet by mouth 2 (two) times daily.   Yes [provider]  famotidine (PEPCID) 20 MG tablet Take 20 mg by mouth 2 (two)  times daily. 08/29/20  Yes [provider]  glucosamine-chondroitin 500-400 MG tablet Take 1 tablet by mouth 3 (three) times daily.   Yes [provider]  losartan (COZAAR) 100 MG tablet Take 100 mg by mouth daily. 09/26/20  Yes [provider]  metFORMIN (GLUCOPHAGE-XR) 500 MG 24 hr tablet Take 500 mg by mouth 2 (two) times daily. 09/26/20  Yes [provider]  metoprolol succinate (TOPROL-XL) 50 MG 24 hr tablet Take 50 mg by mouth daily. Take with or immediately following a meal.   Yes [provider]  montelukast (SINGULAIR) 10 MG tablet Take 1 tablet (10 mg total) by mouth at bedtime. 06/25/20  Yes Ralene Bathe, MD  Multiple Vitamin (MULTIVITAMIN) tablet Take 1 tablet by mouth daily.   Yes [provider]  pantoprazole (PROTONIX) 40 MG tablet Take 1 tablet (40 mg total) by mouth 2 (two) times daily for 14 days. 02/25/22 03/11/22 Yes Duffy Bruce, MD  EPINEPHrine 0.3 mg/0.3 mL IJ SOAJ injection Inject 0.3 mg into the muscle as needed for anaphylaxis. 06/28/20   Ralene Bathe, MD  fluticasone [facility]) 50 MCG/ACT nasal spray Place 2 sprays into both nostrils daily.    [provider]  indapamide (LOZOL) 1.25 MG tablet Take 1.25 mg by mouth daily. 09/28/20   [provider]  montelukast (SINGULAIR) 10 MG tablet Take 1 tablet daily  06/28/20   Ralene Bathe, MD  promethazine-phenylephrine (PROMETHAZINE VC) 6.25-5 MG/5ML SYRP Take 5 mLs by mouth every 4 (four) hours as needed for congestion. 04/30/18   Sable Feil, PA-C  sucralfate (CARAFATE) 1 GM/10ML suspension Take 10 mLs (1 g total) by mouth 4 (four) times daily for 10 days. 02/25/22 03/07/22  Duffy Bruce, MD  terbinafine (LAMISIL) 250 MG tablet Take 1 tablet (250 mg total) by mouth daily. For yeast 02/25/21   Ralene Bathe, MD    Allergies as of 02/26/2022 - Review Complete 02/26/2022  Allergen Reaction Noted   Fluconazole Hives 10/17/2020   Levofloxacin   01/08/2015   Lidocaine  09/22/2019   Aleve [naproxen sodium] Hives 05/07/2012   Augmentin [amoxicillin-pot clavulanate] Hives 05/07/2012   Cefuroxime Hives 03/27/2017   Naprosyn [naproxen] Hives 05/07/2012    Family History  Problem Relation Age of Onset   Hypertension Mother    Arrhythmia Brother    Hyperlipidemia Brother    Hypertension Brother    Breast cancer Neg Hx     Social History   Socioeconomic History   Marital status: Widowed    Spouse name: Not on file   Number of children: Not on file   Years of education: Not on file   Highest education level: Not on file  Occupational History   Not on file  Tobacco Use   Smoking status: Never   Smokeless tobacco: Never  Vaping Use   Vaping Use: Never used  Substance and Sexual Activity   Alcohol use: No   Drug use: No   Sexual activity: Not on file  Other Topics Concern   Not on file  Social History Narrative   Not on file   Social Determinants of Health   Financial Resource Strain: Not on file  Food Insecurity: Not on file  Transportation Needs: Not on file  Physical Activity: Not on file  Stress: Not on file  Social Connections: Not on file  Intimate Partner Violence: Not on file    Review of Systems: See HPI, otherwise negative ROS  Physical Exam: BP (!) 139/48   Pulse 86   Temp (!) 97.4 F (36.3 C) (Temporal)   Resp 19   Ht '5\' 5"'$  (1.651 m)   Wt 102.1 kg   SpO2 100%   BMI 37.44 kg/m  General:   Alert,  pleasant and cooperative in NAD Head:  Normocephalic and atraumatic. Neck:  Supple; no masses or thyromegaly. Lungs:  Clear throughout to auscultation.    Heart:  Regular rate and rhythm. Abdomen:  Soft, nontender and nondistended. Normal bowel sounds, without guarding, and without rebound.   Neurologic:  Alert and  oriented x4;  grossly normal neurologically.  Impression/Plan: Cheryl Key is here for an endoscopy to be performed for dysphagia  Risks, benefits, limitations, and  alternatives regarding  endoscopy have been reviewed with the patient.  Questions have been answered.  All parties agreeable.   Sherri Sear, MD  03/05/2022, 7:41 AM

## 2022-03-05 NOTE — Anesthesia Postprocedure Evaluation (Signed)
Anesthesia Post Note  Patient: Cheryl Key  Procedure(s) Performed: ESOPHAGOGASTRODUODENOSCOPY (EGD) WITH PROPOFOL  Patient location during evaluation: Endoscopy Anesthesia Type: General Level of consciousness: awake and alert Pain management: pain level controlled Vital Signs Assessment: post-procedure vital signs reviewed and stable Respiratory status: spontaneous breathing, nonlabored ventilation, respiratory function stable and patient connected to nasal cannula oxygen Cardiovascular status: blood pressure returned to baseline and stable Postop Assessment: no apparent nausea or vomiting Anesthetic complications: no  No notable events documented.   Last Vitals:  Vitals:   03/05/22 0807 03/05/22 0827  BP: 123/69 138/82  Pulse: 70   Resp: 20   Temp: (!) 36.3 C   SpO2: 99%     Last Pain:  Vitals:   03/05/22 0827  TempSrc:   PainSc: 0-No pain                 Dimas Millin

## 2022-03-05 NOTE — Anesthesia Preprocedure Evaluation (Signed)
Anesthesia Evaluation  Patient identified by MRN, date of birth, ID band Patient awake    Reviewed: Allergy & Precautions, NPO status , Patient's Chart, lab work & pertinent test results  History of Anesthesia Complications Negative for: history of anesthetic complications  Airway Mallampati: III  TM Distance: >3 FB Neck ROM: full    Dental  (+) Dental Advidsory Given   Pulmonary neg shortness of breath, sleep apnea and Continuous Positive Airway Pressure Ventilation , neg COPD   Pulmonary exam normal        Cardiovascular hypertension, (-) angina (-) Past MI and (-) CABG negative cardio ROS Normal cardiovascular exam     Neuro/Psych negative neurological ROS  negative psych ROS   GI/Hepatic Neg liver ROS,GERD  ,,  Endo/Other  diabetes    Renal/GU negative Renal ROS  negative genitourinary   Musculoskeletal   Abdominal   Peds  Hematology negative hematology ROS (+)   Anesthesia Other Findings Past Medical History: 02/22/2020: Basal cell carcinoma     Comment:  right lower leg superficial  No date: Cancer ([place])     Comment:  basal cell skin cancer No date: Carpal tunnel syndrome No date: Gastritis No date: GERD (gastroesophageal reflux disease) No date: History of hepatitis A 02/16/2019: Hx of basal cell carcinoma     Comment:  Left upper back sup. med. scapula. Infiltrative. Excised              04/12/2019 01/07/2007: Hx of dysplastic nevus     Comment:  Right pretibial  10/17/2020: Hyperlipidemia 02/21/2019: Melanocytic nevus     Comment:  right neck No date: Migraines No date: Other and unspecified angina pectoris No date: Unspecified essential hypertension  Past Surgical History: No date: basel cell removal No date: CHOLECYSTECTOMY No date: colonoscopy 03/30/2017: COLONOSCOPY WITH PROPOFOL; N/A     Comment:  Procedure: COLONOSCOPY WITH PROPOFOL;  Surgeon: Manya Silvas, MD;   Location: [facility] ENDOSCOPY;  Service:               Endoscopy;  Laterality: N/A; 03/30/2017: ESOPHAGOGASTRODUODENOSCOPY (EGD) WITH PROPOFOL; N/A     Comment:  Procedure: ESOPHAGOGASTRODUODENOSCOPY (EGD) WITH               PROPOFOL;  Surgeon: Manya Silvas, MD;  Location:               [facility] ENDOSCOPY;  Service: Endoscopy;  Laterality: N/A; No date: FOOT SURGERY     Comment:  x2 No date: FRACTURE SURGERY No date: GALLBLADDER SURGERY No date: ORIF SHOULDER FRACTURE No date: UPPER GI ENDOSCOPY 03/10/1984: VAGINAL HYSTERECTOMY     Comment:  endometriosis  BMI    Body Mass Index: 37.44 kg/m      Reproductive/Obstetrics negative OB ROS                             Anesthesia Physical Anesthesia Plan  ASA: 3  Anesthesia Plan: General   Post-op Pain Management: Minimal or no pain anticipated   Induction: Intravenous  PONV Risk Score and Plan: 3 and Propofol infusion, TIVA and Ondansetron  Airway Management Planned: Nasal Cannula  Additional Equipment: None  Intra-op Plan:   Post-operative Plan:   Informed Consent: I have reviewed the patients History and Physical, chart, labs and discussed the procedure including the risks, benefits and alternatives for the proposed anesthesia with the patient or  authorized representative who has indicated his/her understanding and acceptance.     Dental advisory given  Plan Discussed with: CRNA and Surgeon  Anesthesia Plan Comments: (Discussed risks of anesthesia with patient, including possibility of difficulty with spontaneous ventilation under anesthesia necessitating airway intervention, PONV, and rare risks such as cardiac or respiratory or neurological events, and allergic reactions. Discussed the role of CRNA in patient's perioperative care. Patient understands.)       Anesthesia Quick Evaluation

## 2022-03-05 NOTE — Op Note (Signed)
[facility] Gastroenterology Patient Name: Cheryl Key Procedure Date: 03/05/2022 7:42 AM MRN: [id number] Account #: [id number] Date of Birth: 1945/06/01 Admit Type: Outpatient Age: 76 Room: [facility] ENDO ROOM 4 Gender: Female Note Status: Finalized Instrument Name: Upper Endoscope [id number] Procedure:             Upper GI endoscopy Indications:           Esophageal dysphagia Providers:             Lin Landsman MD, MD Referring MD:          Irven Easterly. Kary Kos, MD (Referring MD) Medicines:             General Anesthesia Complications:         No immediate complications. Estimated blood loss:                         Minimal. Procedure:             Pre-Anesthesia Assessment:                        - Prior to the procedure, a History and Physical was                         performed, and patient medications and allergies were                         reviewed. The patient is competent. The risks and                         benefits of the procedure and the sedation options and                         risks were discussed with the patient. All questions                         were answered and informed consent was obtained.                         Patient identification and proposed procedure were                         verified by the physician, the nurse, the                         anesthesiologist, the anesthetist and the technician                         in the pre-procedure area in the procedure room in the                         endoscopy suite. Mental Status Examination: alert and                         oriented. Airway Examination: normal oropharyngeal                         airway and neck mobility. Respiratory Examination:  clear to auscultation. CV Examination: normal.                         Prophylactic Antibiotics: The patient does not require                         prophylactic antibiotics. Prior Anticoagulants: The                          patient has taken no anticoagulant or antiplatelet                         agents. ASA Grade Assessment: III - A patient with                         severe systemic disease. After reviewing the risks and                         benefits, the patient was deemed in satisfactory                         condition to undergo the procedure. The anesthesia                         plan was to use general anesthesia. Immediately prior                         to administration of medications, the patient was                         re-assessed for adequacy to receive sedatives. The                         heart rate, respiratory rate, oxygen saturations,                         blood pressure, adequacy of pulmonary ventilation, and                         response to care were monitored throughout the                         procedure. The physical status of the patient was                         re-assessed after the procedure.                        After obtaining informed consent, the endoscope was                         passed under direct vision. Throughout the procedure,                         the patient's blood pressure, pulse, and oxygen                         saturations were monitored continuously. The  Endosonoscope was introduced through the mouth, and                         advanced to the second part of duodenum. The upper GI                         endoscopy was accomplished without difficulty. The                         patient tolerated the procedure well. Findings:      A few small sessile polyps with no bleeding were found in the duodenal       bulb. Biopsies were taken with a cold forceps for histology. Estimated       blood loss was minimal.      The second portion of the duodenum was normal.      Diffuse mildly erythematous mucosa without bleeding was found in the       gastric body. Biopsies were taken with a cold forceps for  histology.      The incisura and gastric antrum were normal. Biopsies were taken with a       cold forceps for histology.      The cardia and gastric fundus were normal on retroflexion.      Esophagogastric landmarks were identified: the gastroesophageal junction       was found at 40 cm from the incisors.      Localized mild mucosal changes characterized by scarring and ulceration       were found in the middle third of the esophagus at 30cm. Biopsies were       taken with a cold forceps for histology. Impression:            - A few duodenal polyps. Biopsied.                        - Normal second portion of the duodenum.                        - Erythematous mucosa in the gastric body. Biopsied.                        - Normal incisura and antrum. Biopsied.                        - Esophagogastric landmarks identified.                        - Scarred, ulcerated mucosa in the esophagus. Biopsied. Recommendation:        - Await pathology results.                        - Discharge patient to home (with escort).                        - Resume previous diet today.                        - Continue present medications. Procedure Code(s):     --- Professional ---  42706, Esophagogastroduodenoscopy, flexible,                         transoral; with biopsy, single or multiple Diagnosis Code(s):     --- Professional ---                        K31.7, Polyp of stomach and duodenum                        K31.89, Other diseases of stomach and duodenum                        K22.89, Other specified disease of esophagus                        K22.10, Ulcer of esophagus without bleeding                        R13.14, Dysphagia, pharyngoesophageal phase CPT copyright 2022 [employer] Association. All rights reserved. The codes documented in this report are preliminary and upon coder review may  be revised to meet current compliance requirements. Dr. Ulyess Mort Lin Landsman MD, MD 03/05/2022 8:06:49 AM This report has been signed electronically. Number of Addenda: 0 Note Initiated On: 03/05/2022 7:42 AM Estimated Blood Loss:  Estimated blood loss was minimal.      [facility]

## 2022-03-05 NOTE — Anesthesia Procedure Notes (Signed)
Date/Time: 03/05/2022 7:50 AM  Performed by: Doreen Salvage, CRNAPre-anesthesia Checklist: Patient identified, Emergency Drugs available, Suction available and Patient being monitored Patient Re-evaluated:Patient Re-evaluated prior to induction Oxygen Delivery Method: Nasal cannula Induction Type: IV induction Dental Injury: Teeth and Oropharynx as per pre-operative assessment  Comments: Nasal cannula with etCO2 monitoring

## 2022-03-05 NOTE — Transfer of Care (Signed)
Immediate Anesthesia Transfer of Care Note  Patient: Cheryl Key  Procedure(s) Performed: Procedure(s): ESOPHAGOGASTRODUODENOSCOPY (EGD) WITH PROPOFOL (N/A)  Patient Location: PACU and Endoscopy Unit  Anesthesia Type:General  Level of Consciousness: sedated  Airway & Oxygen Therapy: Patient Spontanous Breathing and Patient connected to nasal cannula oxygen  Post-op Assessment: Report given to RN and Post -op Vital signs reviewed and stable  Post vital signs: Reviewed and stable  Last Vitals:  Vitals:   03/05/22 0730 03/05/22 0807  BP: (!) 139/48 123/69  Pulse: 86 70  Resp: 19 20  Temp: (!) 36.3 C   SpO2: 388% 71%    Complications: No apparent anesthesia complications

## 2022-03-06 ENCOUNTER — Encounter: Payer: Self-pay | Admitting: Gastroenterology

## 2022-03-07 ENCOUNTER — Encounter: Payer: Self-pay | Admitting: Dermatology

## 2022-03-07 LAB — SURGICAL PATHOLOGY

## 2022-03-11 ENCOUNTER — Other Ambulatory Visit: Payer: Self-pay

## 2022-03-12 ENCOUNTER — Ambulatory Visit: Payer: PPO | Admitting: Gastroenterology

## 2022-03-17 ENCOUNTER — Telehealth: Payer: Self-pay

## 2022-03-17 NOTE — Telephone Encounter (Signed)
Patient verbalized understanding of results. Patient states she forgot to cancel the appointment with [facility] but will call them to cancel it

## 2022-03-17 NOTE — Telephone Encounter (Signed)
-----   Message from Lin Landsman, MD sent at 03/14/2022 11:37 AM EST ----- Please inform patient that the pathology results from upper endoscopy came back normal.  She should continue taking Protonix 40 mg twice daily until seen by me in the office on 1/11.  Please let patient know to cancel her appointment with [facility] GI which is scheduled for 05/15/2022  RV

## 2022-03-20 ENCOUNTER — Other Ambulatory Visit: Payer: Self-pay

## 2022-03-20 ENCOUNTER — Ambulatory Visit (INDEPENDENT_AMBULATORY_CARE_PROVIDER_SITE_OTHER): Payer: PPO | Admitting: Gastroenterology

## 2022-03-20 ENCOUNTER — Encounter: Payer: Self-pay | Admitting: Gastroenterology

## 2022-03-20 VITALS — BP 146/82 | HR 65 | Temp 97.8°F | Ht 65.0 in | Wt 238.0 lb

## 2022-03-20 DIAGNOSIS — Z1211 Encounter for screening for malignant neoplasm of colon: Secondary | ICD-10-CM

## 2022-03-20 DIAGNOSIS — Z86010 Personal history of colonic polyps: Secondary | ICD-10-CM

## 2022-03-20 DIAGNOSIS — Z87898 Personal history of other specified conditions: Secondary | ICD-10-CM | POA: Diagnosis not present

## 2022-03-20 MED ORDER — NA SULFATE-K SULFATE-MG SULF 17.5-3.13-1.6 GM/177ML PO SOLN: 354.0000 mL | Freq: Once | ORAL | 0 refills | Status: AC

## 2022-03-20 NOTE — Progress Notes (Signed)
Cephas Darby, MD [address]  [place]  [place], [place] [address]  Main: [phone]  Fax: [phone]    Gastroenterology Consultation  Referring Provider:     [place] Pink, MD Primary Care Physician:  [place] Pink, MD Primary Gastroenterologist:  Dr. Cephas Darby Reason for Consultation: Dysphagia        HPI:   Cheryl Key is a 77 y.o. female referred by Dr. [place] Pink, MD  for consultation & management of difficulty swallowing. Patient went to ER on 02/25/2022 secondary to difficulty swallowing.  She started experiencing it after she took prescription strength ibuprofen in November 2023 after she sprained her right foot.  Patient underwent x-ray esophagus in the ER which revealed possible narrowing of the distal esophagus. Therefore, patient underwent upper endoscopy on 03/05/2022 which revealed healing ulcer in middle third of esophagus.  Patient is started on Protonix 40 mg twice daily which she took for 2 weeks and subsequently on once a day currently.  Patient is currently asymptomatic.  She is no longer taking ibuprofen, no longer experiencing difficulty swallowing, denies any reflux symptoms   NSAIDs: None  Antiplts/Anticoagulants/Anti thrombotics: None  GI Procedures:  Upper endoscopy 03/05/2022  DIAGNOSIS: A. DUODENAL BULB POLYP; COLD BIOPSY: - GASTRIC HETEROTOPIA. - NEGATIVE FOR DYSPLASIA AND MALIGNANCY.  B. 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 - NEGATIVE FOR H. PYLORI, DYSPLASIA, AND MALIGNANCY.  C. ESOPHAGUS, RANDOM; COLD BIOPSY: - BENIGN SQUAMOUS MUCOSA WITH NO SIGNIFICANT HISTOPATHOLOGIC CHANGE. - NO INCREASE IN INTRAEPITHELIAL EOSINOPHILS (LESS THAN 2 PER HPF). - NEGATIVE FOR DYSPLASIA AND MALIGNANCY.  D. ESOPHAGEAL LESION, 30 CM; COLD BIOPSY: - SQUAMOUS MUCOSA WITH ULCERATION, ACUTE INFLAMMATION, AND GRANULATION TISSUE TYPE CHANGES. - NEGATIVE FOR DYSPLASIA AND  MALIGNANCY.  Comment: A GMS special stain is negative for infiltrating fungal elements. Immunohistochemical studies directed against CMV and HSV 1/2 were performed, and are negative.   Past Medical History:  Diagnosis Date   Basal cell carcinoma 02/22/2020   right lower leg superficial    Cancer (HCC)    basal cell skin cancer   Carpal tunnel syndrome    Gastritis    GERD (gastroesophageal reflux disease)    History of hepatitis A    Hx of basal cell carcinoma 02/16/2019   Left upper back sup. med. scapula. Infiltrative. Excised 04/12/2019   Hx of dysplastic nevus 01/07/2007   Right pretibial    Hyperlipidemia 10/17/2020   Melanocytic nevus 02/21/2019   right neck   Migraines    Other and unspecified angina pectoris    Unspecified essential hypertension     Past Surgical History:  Procedure Laterality Date   basel cell removal     CHOLECYSTECTOMY     colonoscopy     COLONOSCOPY WITH PROPOFOL N/A 03/30/2017   Procedure: COLONOSCOPY WITH PROPOFOL;  Surgeon: Manya Silvas, MD;  Location: [facility] ENDOSCOPY;  Service: Endoscopy;  Laterality: N/A;   ESOPHAGOGASTRODUODENOSCOPY (EGD) WITH PROPOFOL N/A 03/30/2017   Procedure: ESOPHAGOGASTRODUODENOSCOPY (EGD) WITH PROPOFOL;  Surgeon: Manya Silvas, MD;  Location: [facility] ENDOSCOPY;  Service: Endoscopy;  Laterality: N/A;   ESOPHAGOGASTRODUODENOSCOPY (EGD) WITH PROPOFOL N/A 03/05/2022   Procedure: ESOPHAGOGASTRODUODENOSCOPY (EGD) WITH PROPOFOL;  Surgeon: Lin Landsman, MD;  Location: [facility] ENDOSCOPY;  Service: Gastroenterology;  Laterality: N/A;   FOOT SURGERY     x2   FRACTURE SURGERY     GALLBLADDER SURGERY     ORIF SHOULDER FRACTURE     UPPER  GI ENDOSCOPY     VAGINAL HYSTERECTOMY  03/10/1984   endometriosis     Current Outpatient Medications:    calcium-vitamin D (OSCAL WITH D) 500-200 MG-UNIT tablet, Take 1 tablet by mouth 2 (two) times daily., Disp: , Rfl:    indapamide (LOZOL) 1.25 MG tablet, Take 1.25 mg by mouth  daily., Disp: , Rfl:    losartan (COZAAR) 100 MG tablet, Take 100 mg by mouth daily., Disp: , Rfl:    metFORMIN (GLUCOPHAGE-XR) 500 MG 24 hr tablet, Take 500 mg by mouth 2 (two) times daily., Disp: , Rfl:    metoprolol succinate (TOPROL-XL) 50 MG 24 hr tablet, Take 50 mg by mouth daily. Take with or immediately following a meal., Disp: , Rfl:    Multiple Vitamin (MULTIVITAMIN) tablet, Take 1 tablet by mouth daily., Disp: , Rfl:    Na Sulfate-K Sulfate-Mg Sulf 17.5-3.13-1.6 GM/177ML SOLN, Take 354 mLs by mouth once for 1 dose., Disp: 354 mL, Rfl: 0   pantoprazole (PROTONIX) 40 MG tablet, Take 1 tablet (40 mg total) by mouth 2 (two) times daily for 14 days., Disp: 28 tablet, Rfl: 0   sucralfate (CARAFATE) 1 GM/10ML suspension, Take 10 mLs (1 g total) by mouth 4 (four) times daily for 10 days. (Patient not taking: Reported on 03/20/2022), Disp: 400 mL, Rfl: 0   Family History  Problem Relation Age of Onset   Hypertension Mother    Arrhythmia Brother    Hyperlipidemia Brother    Hypertension Brother    Breast cancer Neg Hx      Social History   Tobacco Use   Smoking status: Never   Smokeless tobacco: Never  Vaping Use   Vaping Use: Never used  Substance Use Topics   Alcohol use: No   Drug use: No    Allergies as of 03/20/2022 - Review Complete 03/20/2022  Allergen Reaction Noted   Fluconazole Hives 10/17/2020   Levofloxacin  01/08/2015   Lidocaine  09/22/2019   Aleve [naproxen sodium] Hives 05/07/2012   Augmentin [amoxicillin-pot clavulanate] Hives 05/07/2012   Cefuroxime Hives 03/27/2017   Naprosyn [naproxen] Hives 05/07/2012    Review of Systems:    All systems reviewed and negative except where noted in HPI.   Physical Exam:  BP (!) 146/82 (BP Location: Right Arm, Patient Position: Sitting, Cuff Size: Large)   Pulse 65   Temp 97.8 F (36.6 C) (Oral)   Ht '5\' 5"'$  (1.651 m)   Wt 238 lb (108 kg)   BMI 39.61 kg/m  No LMP recorded. Patient has had a  hysterectomy.  General:   Alert,  Well-developed, well-nourished, pleasant and cooperative in NAD Head:  Normocephalic and atraumatic. Eyes:  Sclera clear, no icterus.   Conjunctiva pink. Ears:  Normal auditory acuity. Nose:  No deformity, discharge, or lesions. Mouth:  No deformity or lesions,oropharynx pink & moist. Neck:  Supple; no masses or thyromegaly. Lungs:  Respirations even and unlabored.  Clear throughout to auscultation.   No wheezes, crackles, or rhonchi. No acute distress. Heart:  Regular rate and rhythm; no murmurs, clicks, rubs, or gallops. Abdomen:  Normal bowel sounds. Soft, non-tender and non-distended without masses, hepatosplenomegaly or hernias noted.  No guarding or rebound tenderness.   Rectal: Not performed Msk:  Symmetrical without gross deformities. Good, equal movement & strength bilaterally. Pulses:  Normal pulses noted. Extremities:  No clubbing or edema.  No cyanosis. Neurologic:  Alert and oriented x3;  grossly normal neurologically. Skin:  Intact without significant lesions or rashes. No  jaundice. Psych:  Alert and cooperative. Normal mood and affect.  Imaging Studies: Reviewed  Assessment and Plan:   Cheryl Key is a 77 y.o. pleasant Caucasian female with history of metabolic syndrome, diabetes on metformin, history of dysphagia which has resolved on acid suppression  Dysphagia: Resolved Likely secondary esophagitis from heavy NSAID use, worsening of acid reflux EGD was unremarkable Continue Protonix 40 mg daily for 1 to 2 months, then decrease to over-the-counter Prilosec 20 mg daily Discussed about antireflux lifestyle, information provided  Colon cancer screening Recommend screening colonoscopy  I have discussed alternative options, risks & benefits,  which include, but are not limited to, bleeding, infection, perforation,respiratory complication & drug reaction.  The patient agrees with this plan & written consent will be obtained.      Follow up as needed   Cephas Darby, MD

## 2022-03-26 DIAGNOSIS — R059 Cough, unspecified: Secondary | ICD-10-CM | POA: Diagnosis not present

## 2022-03-26 DIAGNOSIS — R509 Fever, unspecified: Secondary | ICD-10-CM | POA: Diagnosis not present

## 2022-04-08 DIAGNOSIS — E119 Type 2 diabetes mellitus without complications: Secondary | ICD-10-CM | POA: Diagnosis not present

## 2022-04-08 DIAGNOSIS — I10 Essential (primary) hypertension: Secondary | ICD-10-CM | POA: Diagnosis not present

## 2022-04-09 DIAGNOSIS — R8290 Unspecified abnormal findings in urine: Secondary | ICD-10-CM | POA: Diagnosis not present

## 2022-04-15 DIAGNOSIS — D649 Anemia, unspecified: Secondary | ICD-10-CM | POA: Diagnosis not present

## 2022-04-15 DIAGNOSIS — Z87440 Personal history of urinary (tract) infections: Secondary | ICD-10-CM | POA: Diagnosis not present

## 2022-04-15 DIAGNOSIS — I10 Essential (primary) hypertension: Secondary | ICD-10-CM | POA: Diagnosis not present

## 2022-04-15 DIAGNOSIS — E119 Type 2 diabetes mellitus without complications: Secondary | ICD-10-CM | POA: Diagnosis not present

## 2022-04-15 DIAGNOSIS — Z0000 Encounter for general adult medical examination without abnormal findings: Secondary | ICD-10-CM | POA: Diagnosis not present

## 2022-04-15 DIAGNOSIS — N1831 Chronic kidney disease, stage 3a: Secondary | ICD-10-CM | POA: Diagnosis not present

## 2022-04-15 DIAGNOSIS — E871 Hypo-osmolality and hyponatremia: Secondary | ICD-10-CM | POA: Diagnosis not present

## 2022-04-23 DIAGNOSIS — Z87440 Personal history of urinary (tract) infections: Secondary | ICD-10-CM | POA: Diagnosis not present

## 2022-04-23 DIAGNOSIS — E871 Hypo-osmolality and hyponatremia: Secondary | ICD-10-CM | POA: Diagnosis not present

## 2022-04-24 ENCOUNTER — Other Ambulatory Visit: Payer: Self-pay | Admitting: Family Medicine

## 2022-04-24 DIAGNOSIS — Z1231 Encounter for screening mammogram for malignant neoplasm of breast: Secondary | ICD-10-CM

## 2022-05-05 ENCOUNTER — Telehealth: Payer: Self-pay | Admitting: *Deleted

## 2022-05-05 NOTE — Telephone Encounter (Signed)
Patient called office because she ate nuts on Sunday 05/04/2022. Patient also thought her colonoscopy was schedule for Tuesday 05/06/2022. However, after looking in her records, patient is schedule for Thursday 05/08/2022. Patient stated she cannot do on that day she did not realized she already have something on that day. Patient have been reschedule to 05/13/2022.  However, she called back and stated that she will not be able to make on 05/13/2022.  We have reschedule to 05/14/2022.  Patient verbalized understanding. New instructions will be sent.

## 2022-05-13 ENCOUNTER — Ambulatory Visit
Admission: RE | Admit: 2022-05-13 | Discharge: 2022-05-13 | Disposition: A | Discharge status: Home / Self Care | Payer: PPO | Source: Ambulatory Visit | Attending: Family Medicine | Admitting: Family Medicine

## 2022-05-13 ENCOUNTER — Encounter: Payer: Self-pay | Admitting: Gastroenterology

## 2022-05-13 DIAGNOSIS — Z1231 Encounter for screening mammogram for malignant neoplasm of breast: Secondary | ICD-10-CM

## 2022-05-14 ENCOUNTER — Ambulatory Visit: Payer: PPO | Admitting: Certified Registered Nurse Anesthetist

## 2022-05-14 ENCOUNTER — Encounter
Admission: RE | Disposition: A | Discharge status: Home / Self Care | Payer: Self-pay | Source: Home / Self Care | Attending: Gastroenterology

## 2022-05-14 ENCOUNTER — Encounter: Payer: Self-pay | Admitting: Gastroenterology

## 2022-05-14 ENCOUNTER — Ambulatory Visit
Admission: RE | Admit: 2022-05-14 | Discharge: 2022-05-14 | Disposition: A | Discharge status: Home / Self Care | Payer: PPO | Attending: Gastroenterology | Admitting: Gastroenterology

## 2022-05-14 ENCOUNTER — Other Ambulatory Visit: Payer: Self-pay

## 2022-05-14 DIAGNOSIS — D120 Benign neoplasm of cecum: Secondary | ICD-10-CM | POA: Diagnosis not present

## 2022-05-14 DIAGNOSIS — K635 Polyp of colon: Secondary | ICD-10-CM | POA: Diagnosis not present

## 2022-05-14 DIAGNOSIS — K5730 Diverticulosis of large intestine without perforation or abscess without bleeding: Secondary | ICD-10-CM | POA: Diagnosis not present

## 2022-05-14 DIAGNOSIS — K621 Rectal polyp: Secondary | ICD-10-CM | POA: Insufficient documentation

## 2022-05-14 DIAGNOSIS — D128 Benign neoplasm of rectum: Secondary | ICD-10-CM | POA: Diagnosis not present

## 2022-05-14 DIAGNOSIS — K219 Gastro-esophageal reflux disease without esophagitis: Secondary | ICD-10-CM | POA: Diagnosis not present

## 2022-05-14 DIAGNOSIS — D126 Benign neoplasm of colon, unspecified: Secondary | ICD-10-CM | POA: Diagnosis not present

## 2022-05-14 DIAGNOSIS — Z1211 Encounter for screening for malignant neoplasm of colon: Secondary | ICD-10-CM | POA: Diagnosis not present

## 2022-05-14 DIAGNOSIS — E119 Type 2 diabetes mellitus without complications: Secondary | ICD-10-CM | POA: Insufficient documentation

## 2022-05-14 DIAGNOSIS — I209 Angina pectoris, unspecified: Secondary | ICD-10-CM | POA: Insufficient documentation

## 2022-05-14 DIAGNOSIS — Z7984 Long term (current) use of oral hypoglycemic drugs: Secondary | ICD-10-CM | POA: Insufficient documentation

## 2022-05-14 DIAGNOSIS — Z86010 Personal history of colonic polyps: Secondary | ICD-10-CM

## 2022-05-14 DIAGNOSIS — I10 Essential (primary) hypertension: Secondary | ICD-10-CM | POA: Diagnosis not present

## 2022-05-14 DIAGNOSIS — K5790 Diverticulosis of intestine, part unspecified, without perforation or abscess without bleeding: Secondary | ICD-10-CM | POA: Diagnosis not present

## 2022-05-14 HISTORY — PX: COLONOSCOPY WITH PROPOFOL: SHX5780

## 2022-05-14 LAB — GLUCOSE, CAPILLARY: Glucose-Capillary: 139 mg/dL — ABNORMAL HIGH (ref 70–99)

## 2022-05-14 SURGERY — COLONOSCOPY WITH PROPOFOL
Anesthesia: General

## 2022-05-14 MED ORDER — PHENYLEPHRINE 80 MCG/ML (10ML) SYRINGE FOR IV PUSH (FOR BLOOD PRESSURE SUPPORT)
PREFILLED_SYRINGE | INTRAVENOUS | Status: AC
  Filled 2022-05-14: qty 10

## 2022-05-14 MED ORDER — SODIUM CHLORIDE 0.9 % IV SOLN: INTRAVENOUS | Status: DC

## 2022-05-14 MED ORDER — PROPOFOL 10 MG/ML IV BOLUS
INTRAVENOUS | Status: DC | PRN
  Administered 2022-05-14: 70 mg via INTRAVENOUS

## 2022-05-14 MED ORDER — PROPOFOL 500 MG/50ML IV EMUL
INTRAVENOUS | Status: DC | PRN
  Administered 2022-05-14: 150 ug/kg/min via INTRAVENOUS

## 2022-05-14 MED ORDER — PHENYLEPHRINE HCL (PRESSORS) 10 MG/ML IV SOLN
INTRAVENOUS | Status: DC | PRN
  Administered 2022-05-14: 80 ug via INTRAVENOUS

## 2022-05-14 MED ORDER — STERILE WATER FOR IRRIGATION IR SOLN
Status: DC | PRN
  Administered 2022-05-14: 60 mL

## 2022-05-14 NOTE — Anesthesia Preprocedure Evaluation (Signed)
Anesthesia Evaluation  Patient identified by MRN, date of birth, ID band Patient awake    Reviewed: Allergy & Precautions, NPO status , Patient's Chart, lab work & pertinent test results  Airway Mallampati: III  TM Distance: >3 FB Neck ROM: full    Dental  (+) Teeth Intact   Pulmonary neg pulmonary ROS   Pulmonary exam normal breath sounds clear to auscultation       Cardiovascular Exercise Tolerance: Good hypertension, Pt. on medications + angina  negative cardio ROS Normal cardiovascular exam Rhythm:Regular Rate:Normal     Neuro/Psych  Headaches negative neurological ROS  negative psych ROS   GI/Hepatic negative GI ROS, Neg liver ROS,GERD  Medicated,,  Endo/Other  negative endocrine ROSdiabetes, Type 2, Oral Hypoglycemic Agents    Renal/GU negative Renal ROS  negative genitourinary   Musculoskeletal negative musculoskeletal ROS (+)    Abdominal   Peds negative pediatric ROS (+)  Hematology negative hematology ROS (+)   Anesthesia Other Findings Past Medical History: 02/22/2020: Basal cell carcinoma     Comment:  right lower leg superficial  No date: Cancer ([place])     Comment:  basal cell skin cancer No date: Carpal tunnel syndrome No date: Gastritis No date: GERD (gastroesophageal reflux disease) No date: History of hepatitis A 02/16/2019: Hx of basal cell carcinoma     Comment:  Left upper back sup. med. scapula. Infiltrative. Excised              04/12/2019 01/07/2007: Hx of dysplastic nevus     Comment:  Right pretibial  10/17/2020: Hyperlipidemia 02/21/2019: Melanocytic nevus     Comment:  right neck No date: Migraines No date: Other and unspecified angina pectoris No date: Unspecified essential hypertension  Past Surgical History: No date: basel cell removal No date: CHOLECYSTECTOMY No date: colonoscopy 03/30/2017: COLONOSCOPY WITH PROPOFOL; N/A     Comment:  Procedure: COLONOSCOPY WITH  PROPOFOL;  Surgeon: Manya Silvas, MD;  Location: [facility] ENDOSCOPY;  Service:               Endoscopy;  Laterality: N/A; 03/30/2017: ESOPHAGOGASTRODUODENOSCOPY (EGD) WITH PROPOFOL; N/A     Comment:  Procedure: ESOPHAGOGASTRODUODENOSCOPY (EGD) WITH               PROPOFOL;  Surgeon: Manya Silvas, MD;  Location:               [facility] ENDOSCOPY;  Service: Endoscopy;  Laterality: N/A; 03/05/2022: ESOPHAGOGASTRODUODENOSCOPY (EGD) WITH PROPOFOL; N/A     Comment:  Procedure: ESOPHAGOGASTRODUODENOSCOPY (EGD) WITH               PROPOFOL;  Surgeon: Lin Landsman, MD;  Location:               [facility] ENDOSCOPY;  Service: Gastroenterology;  Laterality:               N/A; No date: FOOT SURGERY     Comment:  x2 No date: FRACTURE SURGERY No date: GALLBLADDER SURGERY No date: ORIF SHOULDER FRACTURE No date: UPPER GI ENDOSCOPY 03/10/1984: VAGINAL HYSTERECTOMY     Comment:  endometriosis     Reproductive/Obstetrics negative OB ROS                             Anesthesia Physical Anesthesia Plan  ASA: 3  Anesthesia Plan: General   Post-op Pain Management:  Induction: Intravenous  PONV Risk Score and Plan: Propofol infusion and TIVA  Airway Management Planned: Natural Airway  Additional Equipment:   Intra-op Plan:   Post-operative Plan:   Informed Consent: I have reviewed the patients History and Physical, chart, labs and discussed the procedure including the risks, benefits and alternatives for the proposed anesthesia with the patient or authorized representative who has indicated his/her understanding and acceptance.     Dental Advisory Given  Plan Discussed with: CRNA and Surgeon  Anesthesia Plan Comments:        Anesthesia Quick Evaluation

## 2022-05-14 NOTE — Op Note (Signed)
[facility] Gastroenterology Patient Name: Cheryl Key Procedure Date: 05/14/2022 9:03 AM MRN: [id number] Account #: [id number] Date of Birth: [birth year] Admit Type: Outpatient Age: 77 Room: [facility] ENDO ROOM 4 Gender: Female Note Status: Finalized Instrument Name: Park Meo [id number] Procedure:             Colonoscopy Indications:           Screening for colorectal malignant neoplasm, Last                         colonoscopy: January 2019 Providers:             Lin Landsman MD, MD Referring MD:          Irven Easterly. Kary Kos, MD (Referring MD) Medicines:             General Anesthesia Complications:         No immediate complications. Estimated blood loss: None. Procedure:             Pre-Anesthesia Assessment:                        - Prior to the procedure, a History and Physical was                         performed, and patient medications and allergies were                         reviewed. The patient is competent. The risks and                         benefits of the procedure and the sedation options and                         risks were discussed with the patient. All questions                         were answered and informed consent was obtained.                         Patient identification and proposed procedure were                         verified by the physician, the nurse, the                         anesthesiologist, the anesthetist and the technician                         in the pre-procedure area in the procedure room in the                         endoscopy suite. Mental Status Examination: alert and                         oriented. Airway Examination: normal oropharyngeal                         airway and neck mobility. Respiratory Examination:  clear to auscultation. CV Examination: normal.                         Prophylactic Antibiotics: The patient does not require                         prophylactic  antibiotics. Prior Anticoagulants: The                         patient has taken no anticoagulant or antiplatelet                         agents. ASA Grade Assessment: III - A patient with                         severe systemic disease. After reviewing the risks and                         benefits, the patient was deemed in satisfactory                         condition to undergo the procedure. The anesthesia                         plan was to use general anesthesia. Immediately prior                         to administration of medications, the patient was                         re-assessed for adequacy to receive sedatives. The                         heart rate, respiratory rate, oxygen saturations,                         blood pressure, adequacy of pulmonary ventilation, and                         response to care were monitored throughout the                         procedure. The physical status of the patient was                         re-assessed after the procedure.                        After obtaining informed consent, the colonoscope was                         passed under direct vision. Throughout the procedure,                         the patient's blood pressure, pulse, and oxygen                         saturations were monitored continuously. The  Colonoscope was introduced through the anus and                         advanced to the the cecum, identified by appendiceal                         orifice and ileocecal valve. The colonoscopy was                         performed without difficulty. The patient tolerated                         the procedure well. The quality of the bowel                         preparation was evaluated using the BBPS [facility] Bowel                         Preparation Scale) with scores of: Right Colon = 3,                         Transverse Colon = 3 and Left Colon = 3 (entire mucosa                         seen  well with no residual staining, small fragments                         of stool or opaque liquid). The total BBPS score                         equals 9. The ileocecal valve, appendiceal orifice,                         and rectum were photographed. Findings:      The perianal and digital rectal examinations were normal. Pertinent       negatives include normal sphincter tone and no palpable rectal lesions.      Two sessile polyps were found in the rectum. The polyps were 3 to 4 mm       in size. These polyps were removed with a cold snare. Resection and       retrieval were complete.      A few small-mouthed diverticula were found in the sigmoid colon.      The retroflexed view of the distal rectum and anal verge was normal and       showed no anal or rectal abnormalities. Impression:            - Two 3 to 4 mm polyps in the rectum, removed with a                         cold snare. Resected and retrieved.                        - Diverticulosis in the sigmoid colon.                        - The distal rectum and anal verge are normal on  retroflexion view. Recommendation:        - Discharge patient to home (with escort).                        - Resume previous diet today.                        - Continue present medications.                        - Await pathology results.                        - Repeat colonoscopy in 7-10 years for surveillance                         based on pathology results. Procedure Code(s):     --- Professional ---                        [phone], Colonoscopy, flexible; with removal of                         tumor(s), polyp(s), or other lesion(s) by snare                         technique Diagnosis Code(s):     --- Professional ---                        Z12.11, Encounter for screening for malignant neoplasm                         of colon                        D12.8, Benign neoplasm of rectum                        K57.30,  Diverticulosis of large intestine without                         perforation or abscess without bleeding CPT copyright 2022 American Medical Association. All rights reserved. The codes documented in this report are preliminary and upon coder review may  be revised to meet current compliance requirements. Dr. Ulyess Mort Lin Landsman MD, MD 05/14/2022 9:49:27 AM This report has been signed electronically. Number of Addenda: 0 Note Initiated On: 05/14/2022 9:03 AM Scope Withdrawal Time: 0 hours 9 minutes 49 seconds  Total Procedure Duration: 0 hours 15 minutes 21 seconds  Estimated Blood Loss:  Estimated blood loss: none.      [facility]

## 2022-05-14 NOTE — H&P (Signed)
Cephas Darby, MD [address]  [place]  [place], [place] [address]  Main: [phone]  Fax: [phone] Pager: [phone]  Primary Care Physician:  Mary[place] Pink, MD Primary Gastroenterologist:  Dr. Cephas Darby  Pre-Procedure History & Physical: HPI:  Cheryl Key is a 77 y.o. female is here for an colonoscopy.   Past Medical History:  Diagnosis Date   Basal cell carcinoma 02/22/2020   right lower leg superficial    Cancer (HCC)    basal cell skin cancer   Carpal tunnel syndrome    Gastritis    GERD (gastroesophageal reflux disease)    History of hepatitis A    Hx of basal cell carcinoma 02/16/2019   Left upper back sup. med. scapula. Infiltrative. Excised 04/12/2019   Hx of dysplastic nevus 01/07/2007   Right pretibial    Hyperlipidemia 10/17/2020   Melanocytic nevus 02/21/2019   right neck   Migraines    Other and unspecified angina pectoris    Unspecified essential hypertension     Past Surgical History:  Procedure Laterality Date   basel cell removal     CHOLECYSTECTOMY     colonoscopy     COLONOSCOPY WITH PROPOFOL N/A 03/30/2017   Procedure: COLONOSCOPY WITH PROPOFOL;  Surgeon: Manya Silvas, MD;  Location[facility] ENDOSCOPY;  Service: Endoscopy;  Laterality: N/A;   ESOPHAGOGASTRODUODENOSCOPY (EGD) WITH PROPOFOL N/A 03/30/2017   Procedure: ESOPHAGOGASTRODUODENOSCOPY (EGD) WITH PROPOFOL;  Surgeon: Manya Silvas, MD;  Location: [facility] ENDOSCOPY;  Service: Endoscopy;  Laterality: N/A;   ESOPHAGOGASTRODUODENOSCOPY (EGD) WITH PROPOFOL N/A 03/05/2022   Procedure: ESOPHAGOGASTRODUODENOSCOPY (EGD) WITH PROPOFOL;  Surgeon: Lin Landsman, MD;  Location: [facility] ENDOSCOPY;  Service: Gastroenterology;  Laterality: N/A;   FOOT SURGERY     x2   FRACTURE SURGERY     GALLBLADDER SURGERY     ORIF SHOULDER FRACTURE     UPPER GI ENDOSCOPY     VAGINAL HYSTERECTOMY  03/10/1984   endometriosis    Prior to Admission medications   Medication Sig Start  Date End Date Taking? Authorizing Provider  indapamide (LOZOL) 1.25 MG tablet Take 1.25 mg by mouth daily. 09/28/20  Yes [provider]  losartan (COZAAR) 100 MG tablet Take 100 mg by mouth daily. 09/26/20  Yes [provider]  metFORMIN (GLUCOPHAGE-XR) 500 MG 24 hr tablet Take 500 mg by mouth 2 (two) times daily. 09/26/20  Yes [provider]  metoprolol succinate (TOPROL-XL) 50 MG 24 hr tablet Take 50 mg by mouth daily. Take with or immediately following a meal.   Yes [provider]  calcium-vitamin D (OSCAL WITH D) 500-200 MG-UNIT tablet Take 1 tablet by mouth 2 (two) times daily.    [provider]  Multiple Vitamin (MULTIVITAMIN) tablet Take 1 tablet by mouth daily.    [provider]  pantoprazole (PROTONIX) 40 MG tablet Take 1 tablet (40 mg total) by mouth 2 (two) times daily for 14 days. 02/25/22 03/20/22  Duffy Bruce, MD  sucralfate (CARAFATE) 1 GM/10ML suspension Take 10 mLs (1 g total) by mouth 4 (four) times daily for 10 days. Patient not taking: Reported on 03/20/2022 02/25/22 03/20/22  Duffy Bruce, MD    Allergies as of 03/20/2022 - Review Complete 03/20/2022  Allergen Reaction Noted   Fluconazole Hives 10/17/2020   Levofloxacin  01/08/2015   Lidocaine  09/22/2019   Aleve [naproxen sodium] Hives 05/07/2012   Augmentin [amoxicillin-pot clavulanate] Hives 05/07/2012   Cefuroxime Hives 03/27/2017   Naprosyn [naproxen] Hives 05/07/2012  Family History  Problem Relation Age of Onset   Hypertension Mother    Arrhythmia Brother    Hyperlipidemia Brother    Hypertension Brother    Breast cancer Neg Hx     Social History   Socioeconomic History   Marital status: Widowed    Spouse name: Not on file   Number of children: Not on file   Years of education: Not on file   Highest education level: Not on file  Occupational History   Not on file  Tobacco Use   Smoking status: Never   Smokeless tobacco: Never  Vaping  Use   Vaping Use: Never used  Substance and Sexual Activity   Alcohol use: No   Drug use: No   Sexual activity: Not on file  Other Topics Concern   Not on file  Social History Narrative   Not on file   Social Determinants of Health   Financial Resource Strain: Not on file  Food Insecurity: Not on file  Transportation Needs: Not on file  Physical Activity: Not on file  Stress: Not on file  Social Connections: Not on file  Intimate Partner Violence: Not on file    Review of Systems: See HPI, otherwise negative ROS  Physical Exam: BP (!) 153/79   Pulse 80   Temp (!) 96.4 F (35.8 C) (Temporal)   Resp 20   Ht 5' 4.5" (1.638 m)   Wt 102 kg   SpO2 100%   BMI 37.99 kg/m  General:   Alert,  pleasant and cooperative in NAD Head:  Normocephalic and atraumatic. Neck:  Supple; no masses or thyromegaly. Lungs:  Clear throughout to auscultation.    Heart:  Regular rate and rhythm. Abdomen:  Soft, nontender and nondistended. Normal bowel sounds, without guarding, and without rebound.   Neurologic:  Alert and  oriented x4;  grossly normal neurologically.  Impression/Plan: Cheryl Key is here for an colonoscopy to be performed for colon cancer screening  Risks, benefits, limitations, and alternatives regarding  colonoscopy have been reviewed with the patient.  Questions have been answered.  All parties agreeable.   Sherri Sear, MD  05/14/2022, 9:15 AM

## 2022-05-14 NOTE — Anesthesia Postprocedure Evaluation (Signed)
Anesthesia Post Note  Patient: Cheryl Key  Procedure(s) Performed: COLONOSCOPY WITH PROPOFOL  Patient location during evaluation: PACU Anesthesia Type: General Level of consciousness: awake and awake and alert Pain management: satisfactory to patient Vital Signs Assessment: post-procedure vital signs reviewed and stable Respiratory status: nonlabored ventilation and respiratory function stable Cardiovascular status: stable Anesthetic complications: no   No notable events documented.   Last Vitals:  Vitals:   05/14/22 1009 05/14/22 1010  BP: 114/66   Pulse:    Resp:    Temp:  36.7 C  SpO2: 99% 99%    Last Pain:  Vitals:   05/14/22 1011  TempSrc:   PainSc: 0-No pain                 VAN STAVEREN,Axil Copeman

## 2022-05-14 NOTE — Anesthesia Procedure Notes (Signed)
Date/Time: 05/14/2022 9:27 AM  Performed by: Demetrius Charity, CRNAPre-anesthesia Checklist: Patient identified, Emergency Drugs available, Suction available, Patient being monitored and Timeout performed Patient Re-evaluated:Patient Re-evaluated prior to induction Oxygen Delivery Method: Nasal cannula Induction Type: IV induction Placement Confirmation: CO2 detector and positive ETCO2

## 2022-05-14 NOTE — Transfer of Care (Signed)
Immediate Anesthesia Transfer of Care Note  Patient: Cheryl Key  Procedure(s) Performed: COLONOSCOPY WITH PROPOFOL  Patient Location: PACU  Anesthesia Type:General  Level of Consciousness: drowsy  Airway & Oxygen Therapy: Patient Spontanous Breathing  Post-op Assessment: Report given to RN and Post -op Vital signs reviewed and stable  Post vital signs: Reviewed and stable  Last Vitals:  Vitals Value Taken Time  BP    Temp    Pulse 71 05/14/22 0951  Resp 19 05/14/22 0951  SpO2 98 % 05/14/22 0951  Vitals shown include unvalidated device data.  Last Pain:  Vitals:   05/14/22 0908  TempSrc: Temporal  PainSc: 0-No pain         Complications: No notable events documented.

## 2022-05-15 ENCOUNTER — Encounter: Payer: Self-pay | Admitting: Gastroenterology

## 2022-05-15 LAB — SURGICAL PATHOLOGY

## 2022-05-19 ENCOUNTER — Other Ambulatory Visit: Payer: Self-pay | Admitting: Family Medicine

## 2022-05-19 DIAGNOSIS — N6489 Other specified disorders of breast: Secondary | ICD-10-CM

## 2022-05-19 DIAGNOSIS — R928 Other abnormal and inconclusive findings on diagnostic imaging of breast: Secondary | ICD-10-CM

## 2022-05-23 ENCOUNTER — Ambulatory Visit
Admission: RE | Admit: 2022-05-23 | Discharge: 2022-05-23 | Disposition: A | Discharge status: Home / Self Care | Payer: PPO | Source: Ambulatory Visit | Attending: Family Medicine | Admitting: Family Medicine

## 2022-05-23 DIAGNOSIS — N6489 Other specified disorders of breast: Secondary | ICD-10-CM | POA: Insufficient documentation

## 2022-05-23 DIAGNOSIS — R928 Other abnormal and inconclusive findings on diagnostic imaging of breast: Secondary | ICD-10-CM | POA: Diagnosis not present

## 2022-07-03 DIAGNOSIS — G4733 Obstructive sleep apnea (adult) (pediatric): Secondary | ICD-10-CM | POA: Diagnosis not present

## 2022-10-07 DIAGNOSIS — E119 Type 2 diabetes mellitus without complications: Secondary | ICD-10-CM | POA: Diagnosis not present

## 2022-10-14 DIAGNOSIS — G4733 Obstructive sleep apnea (adult) (pediatric): Secondary | ICD-10-CM | POA: Diagnosis not present

## 2022-10-14 DIAGNOSIS — E119 Type 2 diabetes mellitus without complications: Secondary | ICD-10-CM | POA: Diagnosis not present

## 2022-10-14 DIAGNOSIS — I10 Essential (primary) hypertension: Secondary | ICD-10-CM | POA: Diagnosis not present

## 2022-10-14 DIAGNOSIS — L509 Urticaria, unspecified: Secondary | ICD-10-CM | POA: Diagnosis not present

## 2022-10-14 DIAGNOSIS — E785 Hyperlipidemia, unspecified: Secondary | ICD-10-CM | POA: Diagnosis not present

## 2022-11-04 ENCOUNTER — Emergency Department
Admission: EM | Admit: 2022-11-04 | Discharge: 2022-11-04 | Disposition: A | Discharge status: Home / Self Care | Payer: PPO | Attending: Emergency Medicine | Admitting: Emergency Medicine

## 2022-11-04 ENCOUNTER — Encounter: Payer: Self-pay | Admitting: Emergency Medicine

## 2022-11-04 ENCOUNTER — Other Ambulatory Visit: Payer: Self-pay

## 2022-11-04 ENCOUNTER — Emergency Department: Payer: PPO

## 2022-11-04 DIAGNOSIS — W010XXA Fall on same level from slipping, tripping and stumbling without subsequent striking against object, initial encounter: Secondary | ICD-10-CM | POA: Diagnosis not present

## 2022-11-04 DIAGNOSIS — I10 Essential (primary) hypertension: Secondary | ICD-10-CM | POA: Insufficient documentation

## 2022-11-04 DIAGNOSIS — E042 Nontoxic multinodular goiter: Secondary | ICD-10-CM | POA: Diagnosis not present

## 2022-11-04 DIAGNOSIS — Z85828 Personal history of other malignant neoplasm of skin: Secondary | ICD-10-CM | POA: Diagnosis not present

## 2022-11-04 DIAGNOSIS — W19XXXA Unspecified fall, initial encounter: Secondary | ICD-10-CM

## 2022-11-04 DIAGNOSIS — S199XXA Unspecified injury of neck, initial encounter: Secondary | ICD-10-CM | POA: Diagnosis not present

## 2022-11-04 DIAGNOSIS — S0990XA Unspecified injury of head, initial encounter: Secondary | ICD-10-CM | POA: Insufficient documentation

## 2022-11-04 NOTE — ED Provider Notes (Signed)
[facility] Provider Note    Event Date/Time   First MD Initiated Contact with Patient 11/04/22 2307     (approximate)   History   Fall   HPI  Cheryl Key is a 77 y.o. female   Past medical history of Hypertension and skin cancer who presents with a fall.  She was watering her plants at home and tripped over the garden hose and fell striking her head.  No loss of consciousness.  No other injuries.  Was able to get up with the help of her daughter.  No significant downtime.  No blood thinners.  No other acute medical complaints.        Physical Exam   Triage Vital Signs: ED Triage Vitals  Encounter Vitals Group     BP 11/04/22 2059 (!) 162/79     Systolic BP Percentile --      Diastolic BP Percentile --      Pulse Rate 11/04/22 2059 73     Resp 11/04/22 2059 18     Temp 11/04/22 2059 98.1 F (36.7 C)     Temp Source 11/04/22 2059 Oral     SpO2 11/04/22 2059 100 %     Weight 11/04/22 2100 227 lb 8.2 oz (103.2 kg)     Height 11/04/22 2100 5' 4.5" (1.638 m)     Head Circumference --      Peak Flow --      Pain Score 11/04/22 2058 7     Pain Loc --      Pain Education --      Exclude from Growth Chart --     Most recent vital signs: Vitals:   11/04/22 2059  BP: (!) 162/79  Pulse: 73  Resp: 18  Temp: 98.1 F (36.7 C)  SpO2: 100%    General: Awake, no distress.  CV:  Good peripheral perfusion.  Resp:  Normal effort.  Abd:  No distention.  Other:  Pleasant woman in no acute distress with hypertension no other vital sign abnormalities.  No signs of head trauma, no malocclusion or septal hematoma, neck supple with full range of motion, able to range all extremities full active range of motion, no bony tenderness,, no tenderness to palpation along the chest wall, abdomen, back.   ED Results / Procedures / Treatments   Labs (all labs ordered are listed, but only abnormal results are displayed) Labs Reviewed - No data to  display      RADIOLOGY I independently reviewed and interpreted CT scan of the head see no obvious bleeding or midline shift I also reviewed radiologist's formal read.   PROCEDURES:  Critical Care performed: No  Procedures   MEDICATIONS ORDERED IN ED: Medications - No data to display  IMPRESSION / MDM / ASSESSMENT AND PLAN / ED COURSE  I reviewed the triage vital signs and the nursing notes.                                Patient's presentation is most consistent with acute presentation with potential threat to life or bodily function.  Differential diagnosis includes, but is not limited to, mechanical fall leading to blunt traumatic injury including C-spine fracture dislocation, ICH   The patient is on the cardiac monitor to evaluate for evidence of arrhythmia and/or significant heart rate changes.  MDM:    Mechanical slip and fall in this 77 year old patient, with head  strike.  Concern for ICH or C-spine fracture or dislocation obtain CT head and neck which fortunately looks normal.  No other acute signs of traumatic injury on my examination no report by patient.  Mechanical fall, denies any presyncopal symptoms or medical complaints so I doubt any medical reason for her fall.  Discharge.       FINAL CLINICAL IMPRESSION(S) / ED DIAGNOSES   Final diagnoses:  Fall, initial encounter  Injury of head, initial encounter     Rx / DC Orders   ED Discharge Orders     None        Note:  This document was prepared using Dragon voice recognition software and may include unintentional dictation errors.    Pilar Jarvis, MD 11/04/22 [phone]

## 2022-11-04 NOTE — ED Triage Notes (Signed)
Pt presents ambulatory to triage via POV with complaints of head injury following a fall tonight. Pt was watering her flowers and got caught in her hose and fell landing on a rock on the L side of her head. No LOC; not on thinners; vision changes, N/V. A&Ox4 at this time. Denies CP or SOB.

## 2022-11-04 NOTE — Discharge Instructions (Addendum)
Fortunately your evaluation in the emergency department did not show any dangerous conditions from your fall like bleeding inside your brain or broken bones.  Take acetaminophen 650 mg and ibuprofen 400 mg every 6 hours for pain.  Take with food.  Thank you for choosing [place] for your health care today!  Please see your primary doctor this week for a follow up appointment.   If you have any new, worsening, or unexpected symptoms call your doctor right away or come back to the emergency department for reevaluation.  It was my pleasure to care for you today.   Daneil Dan Modesto Charon, MD

## 2022-12-01 DIAGNOSIS — H5203 Hypermetropia, bilateral: Secondary | ICD-10-CM | POA: Diagnosis not present

## 2022-12-01 DIAGNOSIS — E119 Type 2 diabetes mellitus without complications: Secondary | ICD-10-CM | POA: Diagnosis not present

## 2022-12-01 DIAGNOSIS — H04123 Dry eye syndrome of bilateral lacrimal glands: Secondary | ICD-10-CM | POA: Diagnosis not present

## 2022-12-01 DIAGNOSIS — H53022 Refractive amblyopia, left eye: Secondary | ICD-10-CM | POA: Diagnosis not present

## 2022-12-01 DIAGNOSIS — H2513 Age-related nuclear cataract, bilateral: Secondary | ICD-10-CM | POA: Diagnosis not present

## 2023-01-13 DIAGNOSIS — G4733 Obstructive sleep apnea (adult) (pediatric): Secondary | ICD-10-CM | POA: Diagnosis not present

## 2023-02-26 ENCOUNTER — Encounter: Payer: Self-pay | Admitting: Dermatology

## 2023-02-26 ENCOUNTER — Ambulatory Visit: Payer: PPO | Admitting: Dermatology

## 2023-02-26 DIAGNOSIS — W908XXA Exposure to other nonionizing radiation, initial encounter: Secondary | ICD-10-CM

## 2023-02-26 DIAGNOSIS — Z85828 Personal history of other malignant neoplasm of skin: Secondary | ICD-10-CM | POA: Diagnosis not present

## 2023-02-26 DIAGNOSIS — L821 Other seborrheic keratosis: Secondary | ICD-10-CM | POA: Diagnosis not present

## 2023-02-26 DIAGNOSIS — D1801 Hemangioma of skin and subcutaneous tissue: Secondary | ICD-10-CM

## 2023-02-26 DIAGNOSIS — L570 Actinic keratosis: Secondary | ICD-10-CM

## 2023-02-26 DIAGNOSIS — L814 Other melanin hyperpigmentation: Secondary | ICD-10-CM | POA: Diagnosis not present

## 2023-02-26 DIAGNOSIS — Z86018 Personal history of other benign neoplasm: Secondary | ICD-10-CM | POA: Diagnosis not present

## 2023-02-26 DIAGNOSIS — L820 Inflamed seborrheic keratosis: Secondary | ICD-10-CM | POA: Diagnosis not present

## 2023-02-26 DIAGNOSIS — L578 Other skin changes due to chronic exposure to nonionizing radiation: Secondary | ICD-10-CM

## 2023-02-26 DIAGNOSIS — D229 Melanocytic nevi, unspecified: Secondary | ICD-10-CM

## 2023-02-26 DIAGNOSIS — Z1283 Encounter for screening for malignant neoplasm of skin: Secondary | ICD-10-CM | POA: Diagnosis not present

## 2023-02-26 NOTE — Patient Instructions (Addendum)

## 2023-02-26 NOTE — Progress Notes (Signed)
Follow-Up Visit   Subjective  Cheryl Key is a 77 y.o. female who presents for the following: Skin Cancer Screening and Full Body Skin Exam, check spot face, 77m, no symptoms, check spot L leg 53m, no symptoms, check spot R index finger  The patient presents for Total-Body Skin Exam (TBSE) for skin cancer screening and mole check. The patient has spots, moles and lesions to be evaluated, some may be new or changing and the patient may have concern these could be cancer.  The following portions of the chart were reviewed this encounter and updated as appropriate: medications, allergies, medical history  Review of Systems:  No other skin or systemic complaints except as noted in HPI or Assessment and Plan.  Objective  Well appearing patient in no apparent distress; mood and affect are within normal limits.  A full examination was performed including scalp, head, eyes, ears, nose, lips, neck, chest, axillae, abdomen, back, buttocks, bilateral upper extremities, bilateral lower extremities, hands, feet, fingers, toes, fingernails, and toenails. All findings within normal limits unless otherwise noted below.   Relevant physical exam findings are noted in the Assessment and Plan.  R index finger x 2, face x 4 (6) Pink scaly macules L dorsum hand x 1, R cheek x 1, L calf x 1 (3) Stuck on waxy paps with erythema  Assessment & Plan   SKIN CANCER SCREENING PERFORMED TODAY.  ACTINIC DAMAGE - Chronic condition, secondary to cumulative UV/sun exposure - diffuse scaly erythematous macules with underlying dyspigmentation - Recommend daily broad spectrum sunscreen SPF 30+ to sun-exposed areas, reapply every 2 hours as needed.  - Staying in the shade or wearing long sleeves, sun glasses (UVA+UVB protection) and wide brim hats (4-inch brim around the entire circumference of the hat) are also recommended for sun protection.  - Call for new or changing lesions.  LENTIGINES, SEBORRHEIC KERATOSES,  HEMANGIOMAS - Benign normal skin lesions - Benign-appearing - Call for any changes  MELANOCYTIC NEVI - Tan-brown and/or pink-flesh-colored symmetric macules and papules - Benign appearing on exam today - Observation - Call clinic for new or changing moles - Recommend daily use of broad spectrum spf 30+ sunscreen to sun-exposed areas.   HISTORY OF BASAL CELL CARCINOMA OF THE SKIN - No evidence of recurrence today - Recommend regular full body skin exams - Recommend daily broad spectrum sunscreen SPF 30+ to sun-exposed areas, reapply every 2 hours as needed.  - Call if any new or changing lesions are noted between office visits  - L upper back sup med scapula  HISTORY OF DYSPLASTIC NEVUS No evidence of recurrence today Recommend regular full body skin exams Recommend daily broad spectrum sunscreen SPF 30+ to sun-exposed areas, reapply every 2 hours as needed.  Call if any new or changing lesions are noted between office visits  - R pretibial  AK (ACTINIC KERATOSIS) (6) R index finger x 2, face x 4 (6) If AK R index finger not resolved ~6wks will recommend bx  Actinic keratoses are precancerous spots that appear secondary to cumulative UV radiation exposure/sun exposure over time. They are chronic with expected duration over 1 year. A portion of actinic keratoses will progress to squamous cell carcinoma of the skin. It is not possible to reliably predict which spots will progress to skin cancer and so treatment is recommended to prevent development of skin cancer.  Recommend daily broad spectrum sunscreen SPF 30+ to sun-exposed areas, reapply every 2 hours as needed.  Recommend staying in the  shade or wearing long sleeves, sun glasses (UVA+UVB protection) and wide brim hats (4-inch brim around the entire circumference of the hat). Call for new or changing lesions. Destruction of lesion - R index finger x 2, face x 4 (6) Complexity: simple   Destruction method: cryotherapy   Informed  consent: discussed and consent obtained   Timeout:  patient name, date of birth, surgical site, and procedure verified Lesion destroyed using liquid nitrogen: Yes   Region frozen until ice ball extended beyond lesion: Yes   Outcome: patient tolerated procedure well with no complications   Post-procedure details: wound care instructions given   INFLAMED SEBORRHEIC KERATOSIS (3) L dorsum hand x 1, R cheek x 1, L calf x 1 (3) Symptomatic, irritating, patient would like treated.  Recheck R cheek on f/u Destruction of lesion - L dorsum hand x 1, R cheek x 1, L calf x 1 (3) Complexity: simple   Destruction method: cryotherapy   Informed consent: discussed and consent obtained   Timeout:  patient name, date of birth, surgical site, and procedure verified Lesion destroyed using liquid nitrogen: Yes   Region frozen until ice ball extended beyond lesion: Yes   Outcome: patient tolerated procedure well with no complications   Post-procedure details: wound care instructions given   Return for recheck AK R index finger, ISK R cheek TBSE , Hx of BCC, Hx of Dysplastic nevi, Hx of AKs.  I, Ardis Rowan, RMA, am acting as scribe for Armida Sans, MD .   Documentation: I have reviewed the above documentation for accuracy and completeness, and I agree with the above.  Armida Sans, MD

## 2023-03-08 ENCOUNTER — Encounter: Payer: Self-pay | Admitting: Dermatology

## 2023-03-10 ENCOUNTER — Ambulatory Visit: Payer: PPO | Admitting: Podiatry

## 2023-03-19 ENCOUNTER — Ambulatory Visit: Payer: PPO | Admitting: Podiatry

## 2023-03-19 ENCOUNTER — Encounter: Payer: Self-pay | Admitting: Podiatry

## 2023-03-19 DIAGNOSIS — M7672 Peroneal tendinitis, left leg: Secondary | ICD-10-CM

## 2023-03-19 NOTE — Progress Notes (Signed)
 Subjective:  Patient ID: Cheryl Key, female    DOB: [birth year],  MRN: [id number]  Chief Complaint  Patient presents with   Foot Pain    My left foot hurts on top of my foot.    78 y.o. female presents with the above complaint.  Patient presents with complaint left lateral foot pain.  Patient states came out of nowhere has been hurting with ambulation.  She had injury back few months ago which may have caused this.  She has not seen MRIs prior to seeing me she would like to discuss treatment options pain scale 7 out of 10 dull aching nature   Review of Systems: Negative except as noted in the HPI. Denies N/V/F/Ch.  Past Medical History:  Diagnosis Date   Actinic keratosis    Basal cell carcinoma 02/22/2020   right lower leg superficial    Cancer (HCC)    basal cell skin cancer   Carpal tunnel syndrome    Gastritis    GERD (gastroesophageal reflux disease)    History of hepatitis A    Hx of basal cell carcinoma 02/16/2019   Left upper back sup. med. scapula. Infiltrative. Excised 04/12/2019   Hx of dysplastic nevus 01/07/2007   Right pretibial    Hyperlipidemia 10/17/2020   Melanocytic nevus 02/21/2019   right neck   Migraines    Other and unspecified angina pectoris    Unspecified essential hypertension     Current Outpatient Medications:    calcium-vitamin D (OSCAL WITH D) 500-200 MG-UNIT tablet, Take 1 tablet by mouth 2 (two) times daily., Disp: , Rfl:    indapamide (LOZOL) 1.25 MG tablet, Take 1.25 mg by mouth daily., Disp: , Rfl:    losartan (COZAAR) 100 MG tablet, Take 100 mg by mouth daily., Disp: , Rfl:    metFORMIN (GLUCOPHAGE-XR) 500 MG 24 hr tablet, Take 500 mg by mouth 2 (two) times daily., Disp: , Rfl:    metoprolol  succinate (TOPROL -XL) 50 MG 24 hr tablet, Take 50 mg by mouth daily. Take with or immediately following a meal., Disp: , Rfl:    Multiple Vitamin (MULTIVITAMIN) tablet, Take 1 tablet by mouth daily., Disp: , Rfl:    pantoprazole  (PROTONIX ) 40  MG tablet, Take 1 tablet (40 mg total) by mouth 2 (two) times daily for 14 days., Disp: 28 tablet, Rfl: 0   sucralfate  (CARAFATE ) 1 GM/10ML suspension, Take 10 mLs (1 g total) by mouth 4 (four) times daily for 10 days. (Patient not taking: Reported on 03/20/2022), Disp: 400 mL, Rfl: 0  Social History   Tobacco Use  Smoking Status Never  Smokeless Tobacco Never    Allergies  Allergen Reactions   Fluconazole Hives   Levofloxacin     Other reaction(s): Other (See Comments) Tendon tear   Lidocaine      Other reaction(s): Other (See Comments) hypotension   Aleve [Naproxen Sodium] Hives   Augmentin [Amoxicillin-Pot Clavulanate] Hives   Cefuroxime Hives   Naprosyn [Naproxen] Hives   Objective:  There were no vitals filed for this visit. There is no height or weight on file to calculate BMI. Constitutional Well developed. Well nourished.  Vascular Dorsalis pedis pulses palpable bilaterally. Posterior tibial pulses palpable bilaterally. Capillary refill normal to all digits.  No cyanosis or clubbing noted. Pedal hair growth normal.  Neurologic Normal speech. Oriented to person, place, and time. Epicritic sensation to light touch grossly present bilaterally.  Dermatologic Nails well groomed and normal in appearance. No open wounds. No skin lesions.  Orthopedic: Pain on palpation along the course of the peroneal tendon including the insertion pain with resisted dorsiflexion eversion of the foot.  No pain with plantarflexion inversion of the foot.  No pain at the posterior tibial tendon Achilles tendon ATFL ligament   Radiographs: None Assessment:   1. Peroneal tendinitis, left    Plan:  Patient was evaluated and treated and all questions answered.  Left peroneal tendinitis -All questions and concerns were discussed with the patient in extensive detail given the amount of pain that she is experiencing she will benefit from cam boot immobilization help decrease inflammatory  component associate with pain.  Patient agrees with plan like to proceed with cam boot immobilization -Cam boot was dispensed  No follow-ups on file.

## 2023-03-24 ENCOUNTER — Ambulatory Visit: Payer: PPO | Admitting: Podiatry

## 2023-04-09 ENCOUNTER — Other Ambulatory Visit: Payer: Self-pay | Admitting: Family Medicine

## 2023-04-09 DIAGNOSIS — Z1231 Encounter for screening mammogram for malignant neoplasm of breast: Secondary | ICD-10-CM

## 2023-04-14 DIAGNOSIS — I10 Essential (primary) hypertension: Secondary | ICD-10-CM | POA: Diagnosis not present

## 2023-04-14 DIAGNOSIS — E119 Type 2 diabetes mellitus without complications: Secondary | ICD-10-CM | POA: Diagnosis not present

## 2023-04-14 DIAGNOSIS — E785 Hyperlipidemia, unspecified: Secondary | ICD-10-CM | POA: Diagnosis not present

## 2023-04-16 ENCOUNTER — Ambulatory Visit: Payer: PPO | Admitting: Podiatry

## 2023-04-16 ENCOUNTER — Encounter: Payer: Self-pay | Admitting: Podiatry

## 2023-04-16 DIAGNOSIS — M7672 Peroneal tendinitis, left leg: Secondary | ICD-10-CM | POA: Diagnosis not present

## 2023-04-16 DIAGNOSIS — Q666 Other congenital valgus deformities of feet: Secondary | ICD-10-CM

## 2023-04-16 NOTE — Progress Notes (Signed)
 Subjective:  Patient ID: Cheryl Key, female    DOB: 1945-05-19,  MRN: [id number]  Chief Complaint  Patient presents with   Tendonitis    It's better.    78 y.o. female presents with the above complaint.  Patient presents for follow-up of left peroneal tendinitis she states she is doing better the cam boot immobilization helped.  She would like to discuss next treatment plan   Review of Systems: Negative except as noted in the HPI. Denies N/V/F/Ch.  Past Medical History:  Diagnosis Date   Actinic keratosis    Basal cell carcinoma 02/22/2020   right lower leg superficial    Cancer (HCC)    basal cell skin cancer   Carpal tunnel syndrome    Gastritis    GERD (gastroesophageal reflux disease)    History of hepatitis A    Hx of basal cell carcinoma 02/16/2019   Left upper back sup. med. scapula. Infiltrative. Excised 04/12/2019   Hx of dysplastic nevus 01/07/2007   Right pretibial    Hyperlipidemia 10/17/2020   Melanocytic nevus 02/21/2019   right neck   Migraines    Other and unspecified angina pectoris    Unspecified essential hypertension     Current Outpatient Medications:    calcium-vitamin D (OSCAL WITH D) 500-200 MG-UNIT tablet, Take 1 tablet by mouth 2 (two) times daily., Disp: , Rfl:    indapamide (LOZOL) 1.25 MG tablet, Take 1.25 mg by mouth daily., Disp: , Rfl:    losartan (COZAAR) 100 MG tablet, Take 100 mg by mouth daily., Disp: , Rfl:    metFORMIN (GLUCOPHAGE-XR) 500 MG 24 hr tablet, Take 500 mg by mouth 2 (two) times daily., Disp: , Rfl:    metoprolol  succinate (TOPROL -XL) 50 MG 24 hr tablet, Take 50 mg by mouth daily. Take with or immediately following a meal., Disp: , Rfl:    Multiple Vitamin (MULTIVITAMIN) tablet, Take 1 tablet by mouth daily., Disp: , Rfl:    pantoprazole  (PROTONIX ) 40 MG tablet, Take 1 tablet (40 mg total) by mouth 2 (two) times daily for 14 days., Disp: 28 tablet, Rfl: 0   sucralfate  (CARAFATE ) 1 GM/10ML suspension, Take 10 mLs (1 g  total) by mouth 4 (four) times daily for 10 days. (Patient not taking: Reported on 03/20/2022), Disp: 400 mL, Rfl: 0  Social History   Tobacco Use  Smoking Status Never  Smokeless Tobacco Never    Allergies  Allergen Reactions   Fluconazole Hives   Levofloxacin     Other reaction(s): Other (See Comments) Tendon tear   Lidocaine      Other reaction(s): Other (See Comments) hypotension   Aleve [Naproxen Sodium] Hives   Augmentin [Amoxicillin-Pot Clavulanate] Hives   Cefuroxime Hives   Naprosyn [Naproxen] Hives   Objective:  There were no vitals filed for this visit. There is no height or weight on file to calculate BMI. Constitutional Well developed. Well nourished.  Vascular Dorsalis pedis pulses palpable bilaterally. Posterior tibial pulses palpable bilaterally. Capillary refill normal to all digits.  No cyanosis or clubbing noted. Pedal hair growth normal.  Neurologic Normal speech. Oriented to person, place, and time. Epicritic sensation to light touch grossly present bilaterally.  Dermatologic Nails well groomed and normal in appearance. No open wounds. No skin lesions.  Orthopedic: Pain on palpation along the course of the peroneal tendon including the insertion pain with resisted dorsiflexion eversion of the foot.  No pain with plantarflexion inversion of the foot.  No pain at the posterior tibial tendon  Achilles tendon ATFL ligament   Radiographs: None Assessment:   No diagnosis found.  Plan:  Patient was evaluated and treated and all questions answered.  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 -A steroid injection was performed at left lateral ankle at point of maximal tenderness using 1% plain Lidocaine  and 10 mg of Kenalog . This was well tolerated. -Shoe gear modification discussed   No follow-ups on file.

## 2023-04-21 DIAGNOSIS — N1831 Chronic kidney disease, stage 3a: Secondary | ICD-10-CM | POA: Diagnosis not present

## 2023-04-21 DIAGNOSIS — E785 Hyperlipidemia, unspecified: Secondary | ICD-10-CM | POA: Diagnosis not present

## 2023-04-21 DIAGNOSIS — R8290 Unspecified abnormal findings in urine: Secondary | ICD-10-CM | POA: Diagnosis not present

## 2023-04-21 DIAGNOSIS — Z1331 Encounter for screening for depression: Secondary | ICD-10-CM | POA: Diagnosis not present

## 2023-04-21 DIAGNOSIS — Z0000 Encounter for general adult medical examination without abnormal findings: Secondary | ICD-10-CM | POA: Diagnosis not present

## 2023-04-21 DIAGNOSIS — R202 Paresthesia of skin: Secondary | ICD-10-CM | POA: Diagnosis not present

## 2023-04-21 DIAGNOSIS — I10 Essential (primary) hypertension: Secondary | ICD-10-CM | POA: Diagnosis not present

## 2023-04-21 DIAGNOSIS — D649 Anemia, unspecified: Secondary | ICD-10-CM | POA: Diagnosis not present

## 2023-04-21 DIAGNOSIS — E119 Type 2 diabetes mellitus without complications: Secondary | ICD-10-CM | POA: Diagnosis not present

## 2023-04-22 ENCOUNTER — Other Ambulatory Visit: Payer: Self-pay | Admitting: Family Medicine

## 2023-04-22 DIAGNOSIS — E785 Hyperlipidemia, unspecified: Secondary | ICD-10-CM

## 2023-04-23 ENCOUNTER — Ambulatory Visit
Admission: RE | Admit: 2023-04-23 | Discharge: 2023-04-23 | Disposition: A | Discharge status: Home / Self Care | Payer: Self-pay | Source: Ambulatory Visit | Attending: Family Medicine | Admitting: Family Medicine

## 2023-04-23 DIAGNOSIS — E785 Hyperlipidemia, unspecified: Secondary | ICD-10-CM | POA: Insufficient documentation

## 2023-05-06 DIAGNOSIS — G4733 Obstructive sleep apnea (adult) (pediatric): Secondary | ICD-10-CM | POA: Diagnosis not present

## 2023-05-14 ENCOUNTER — Ambulatory Visit
Admission: RE | Admit: 2023-05-14 | Discharge: 2023-05-14 | Disposition: A | Discharge status: Home / Self Care | Payer: PPO | Source: Ambulatory Visit | Attending: Family Medicine | Admitting: Family Medicine

## 2023-05-14 DIAGNOSIS — Z1231 Encounter for screening mammogram for malignant neoplasm of breast: Secondary | ICD-10-CM | POA: Insufficient documentation

## 2023-05-18 DIAGNOSIS — R051 Acute cough: Secondary | ICD-10-CM | POA: Diagnosis not present

## 2023-05-18 DIAGNOSIS — R0602 Shortness of breath: Secondary | ICD-10-CM | POA: Diagnosis not present

## 2023-05-18 DIAGNOSIS — E119 Type 2 diabetes mellitus without complications: Secondary | ICD-10-CM | POA: Diagnosis not present

## 2023-05-19 DIAGNOSIS — M79652 Pain in left thigh: Secondary | ICD-10-CM | POA: Diagnosis not present

## 2023-06-01 DIAGNOSIS — M5416 Radiculopathy, lumbar region: Secondary | ICD-10-CM | POA: Diagnosis not present

## 2023-06-08 DIAGNOSIS — M79652 Pain in left thigh: Secondary | ICD-10-CM | POA: Diagnosis not present

## 2023-06-17 DIAGNOSIS — M5416 Radiculopathy, lumbar region: Secondary | ICD-10-CM | POA: Diagnosis not present

## 2023-06-17 DIAGNOSIS — M25552 Pain in left hip: Secondary | ICD-10-CM | POA: Diagnosis not present

## 2023-06-23 DIAGNOSIS — M25552 Pain in left hip: Secondary | ICD-10-CM | POA: Diagnosis not present

## 2023-06-23 DIAGNOSIS — M5416 Radiculopathy, lumbar region: Secondary | ICD-10-CM | POA: Diagnosis not present

## 2023-06-25 DIAGNOSIS — M5416 Radiculopathy, lumbar region: Secondary | ICD-10-CM | POA: Diagnosis not present

## 2023-06-25 DIAGNOSIS — M25552 Pain in left hip: Secondary | ICD-10-CM | POA: Diagnosis not present

## 2023-06-30 DIAGNOSIS — M25552 Pain in left hip: Secondary | ICD-10-CM | POA: Diagnosis not present

## 2023-06-30 DIAGNOSIS — M5416 Radiculopathy, lumbar region: Secondary | ICD-10-CM | POA: Diagnosis not present

## 2023-07-02 DIAGNOSIS — M25552 Pain in left hip: Secondary | ICD-10-CM | POA: Diagnosis not present

## 2023-07-02 DIAGNOSIS — M5416 Radiculopathy, lumbar region: Secondary | ICD-10-CM | POA: Diagnosis not present

## 2023-07-07 DIAGNOSIS — M25552 Pain in left hip: Secondary | ICD-10-CM | POA: Diagnosis not present

## 2023-07-07 DIAGNOSIS — M5416 Radiculopathy, lumbar region: Secondary | ICD-10-CM | POA: Diagnosis not present

## 2023-07-09 DIAGNOSIS — M25552 Pain in left hip: Secondary | ICD-10-CM | POA: Diagnosis not present

## 2023-07-09 DIAGNOSIS — M5416 Radiculopathy, lumbar region: Secondary | ICD-10-CM | POA: Diagnosis not present

## 2023-07-14 DIAGNOSIS — M25552 Pain in left hip: Secondary | ICD-10-CM | POA: Diagnosis not present

## 2023-07-14 DIAGNOSIS — M5416 Radiculopathy, lumbar region: Secondary | ICD-10-CM | POA: Diagnosis not present

## 2023-07-20 DIAGNOSIS — M25552 Pain in left hip: Secondary | ICD-10-CM | POA: Diagnosis not present

## 2023-07-20 DIAGNOSIS — M545 Low back pain, unspecified: Secondary | ICD-10-CM | POA: Diagnosis not present

## 2023-07-20 DIAGNOSIS — M79652 Pain in left thigh: Secondary | ICD-10-CM | POA: Diagnosis not present

## 2023-07-21 DIAGNOSIS — M25552 Pain in left hip: Secondary | ICD-10-CM | POA: Diagnosis not present

## 2023-07-21 DIAGNOSIS — M5416 Radiculopathy, lumbar region: Secondary | ICD-10-CM | POA: Diagnosis not present

## 2023-07-27 DIAGNOSIS — G4733 Obstructive sleep apnea (adult) (pediatric): Secondary | ICD-10-CM | POA: Diagnosis not present

## 2023-07-28 DIAGNOSIS — M5416 Radiculopathy, lumbar region: Secondary | ICD-10-CM | POA: Diagnosis not present

## 2023-07-28 DIAGNOSIS — M25552 Pain in left hip: Secondary | ICD-10-CM | POA: Diagnosis not present

## 2023-08-04 DIAGNOSIS — M5416 Radiculopathy, lumbar region: Secondary | ICD-10-CM | POA: Diagnosis not present

## 2023-08-04 DIAGNOSIS — M25552 Pain in left hip: Secondary | ICD-10-CM | POA: Diagnosis not present

## 2023-08-11 DIAGNOSIS — M25552 Pain in left hip: Secondary | ICD-10-CM | POA: Diagnosis not present

## 2023-08-11 DIAGNOSIS — M5416 Radiculopathy, lumbar region: Secondary | ICD-10-CM | POA: Diagnosis not present

## 2023-08-13 DIAGNOSIS — G4733 Obstructive sleep apnea (adult) (pediatric): Secondary | ICD-10-CM | POA: Diagnosis not present

## 2023-08-17 DIAGNOSIS — M5416 Radiculopathy, lumbar region: Secondary | ICD-10-CM | POA: Diagnosis not present

## 2023-08-17 DIAGNOSIS — M25552 Pain in left hip: Secondary | ICD-10-CM | POA: Diagnosis not present

## 2023-08-18 DIAGNOSIS — M545 Low back pain, unspecified: Secondary | ICD-10-CM | POA: Diagnosis not present

## 2023-08-18 DIAGNOSIS — M25552 Pain in left hip: Secondary | ICD-10-CM | POA: Diagnosis not present

## 2023-09-03 DIAGNOSIS — M5416 Radiculopathy, lumbar region: Secondary | ICD-10-CM | POA: Diagnosis not present

## 2023-09-03 DIAGNOSIS — M25552 Pain in left hip: Secondary | ICD-10-CM | POA: Diagnosis not present

## 2023-09-24 DIAGNOSIS — M792 Neuralgia and neuritis, unspecified: Secondary | ICD-10-CM | POA: Diagnosis not present

## 2023-09-24 DIAGNOSIS — M48061 Spinal stenosis, lumbar region without neurogenic claudication: Secondary | ICD-10-CM | POA: Diagnosis not present

## 2023-10-06 DIAGNOSIS — E119 Type 2 diabetes mellitus without complications: Secondary | ICD-10-CM | POA: Diagnosis not present

## 2023-10-06 DIAGNOSIS — D649 Anemia, unspecified: Secondary | ICD-10-CM | POA: Diagnosis not present

## 2023-10-13 DIAGNOSIS — D649 Anemia, unspecified: Secondary | ICD-10-CM | POA: Diagnosis not present

## 2023-10-13 DIAGNOSIS — I10 Essential (primary) hypertension: Secondary | ICD-10-CM | POA: Diagnosis not present

## 2023-10-13 DIAGNOSIS — E119 Type 2 diabetes mellitus without complications: Secondary | ICD-10-CM | POA: Diagnosis not present

## 2023-10-13 DIAGNOSIS — R42 Dizziness and giddiness: Secondary | ICD-10-CM | POA: Diagnosis not present

## 2023-10-13 DIAGNOSIS — E871 Hypo-osmolality and hyponatremia: Secondary | ICD-10-CM | POA: Diagnosis not present

## 2023-10-13 DIAGNOSIS — E785 Hyperlipidemia, unspecified: Secondary | ICD-10-CM | POA: Diagnosis not present

## 2023-11-03 DIAGNOSIS — E041 Nontoxic single thyroid nodule: Secondary | ICD-10-CM | POA: Diagnosis not present

## 2023-11-03 DIAGNOSIS — H903 Sensorineural hearing loss, bilateral: Secondary | ICD-10-CM | POA: Diagnosis not present

## 2023-11-05 ENCOUNTER — Other Ambulatory Visit: Payer: Self-pay | Admitting: Otolaryngology

## 2023-11-05 DIAGNOSIS — E079 Disorder of thyroid, unspecified: Secondary | ICD-10-CM

## 2023-11-17 ENCOUNTER — Ambulatory Visit
Admission: RE | Admit: 2023-11-17 | Discharge: 2023-11-17 | Disposition: A | Discharge status: Home / Self Care | Source: Ambulatory Visit | Attending: Otolaryngology | Admitting: Otolaryngology

## 2023-11-17 DIAGNOSIS — E079 Disorder of thyroid, unspecified: Secondary | ICD-10-CM | POA: Diagnosis not present

## 2023-11-17 DIAGNOSIS — R221 Localized swelling, mass and lump, neck: Secondary | ICD-10-CM | POA: Diagnosis not present

## 2023-11-25 ENCOUNTER — Ambulatory Visit
Admission: EM | Admit: 2023-11-25 | Discharge: 2023-11-25 | Disposition: A | Discharge status: Home / Self Care | Attending: Family Medicine | Admitting: Family Medicine

## 2023-11-25 DIAGNOSIS — L509 Urticaria, unspecified: Secondary | ICD-10-CM

## 2023-11-25 DIAGNOSIS — U071 COVID-19: Secondary | ICD-10-CM

## 2023-11-25 DIAGNOSIS — R220 Localized swelling, mass and lump, head: Secondary | ICD-10-CM | POA: Diagnosis not present

## 2023-11-25 MED ORDER — DEXAMETHASONE SODIUM PHOSPHATE 10 MG/ML IJ SOLN
10.0000 mg | Freq: Once | INTRAMUSCULAR | Status: AC
  Administered 2023-11-25: 10 mg via INTRAMUSCULAR

## 2023-11-25 MED ORDER — CETIRIZINE HCL 10 MG PO TABS: 10.0000 mg | ORAL_TABLET | Freq: Two times a day (BID) | ORAL | 0 refills | Status: AC

## 2023-11-25 MED ORDER — PREDNISONE 10 MG PO TABS: ORAL_TABLET | ORAL | 0 refills | Status: AC

## 2023-11-25 NOTE — Discharge Instructions (Addendum)
 Start your prednisone  tomorrow. Take Zrytec twice a day. Use Benadryl as needed.  Follow up with your dermatologist.   Go to ED for red flag symptoms, including; fevers you cannot reduce with Tylenol/Motrin, severe headaches, vision changes, numbness/weakness in part of the body, lethargy, confusion, intractable vomiting, severe dehydration, chest pain, breathing difficulty, severe persistent abdominal or pelvic pain, signs of severe infection (increased redness, swelling of an area), feeling faint or passing out, dizziness, etc. You should especially go to the ED for sudden acute worsening of condition if you do not elect to go at this time.

## 2023-11-25 NOTE — ED Triage Notes (Signed)
 Pt presents to UC d/t testing + for covid on Monday. Had a fever 2 days ago. Tmax 100.1 last night. Today she noticed lip swelling which has happened in the past when she had covid. Denies any cyanosis. Has tried benadryl, last dose around 1 hr ago.

## 2023-11-25 NOTE — ED Provider Notes (Signed)
 [facility]    CSN: [id number] Arrival date & time: 11/25/23  1802      History   Chief Complaint Chief Complaint  Patient presents with   Covid Positive   Oral Swelling    HPI Cheryl Key is a 78 y.o. female.   HPI  Cheryl Key presents for hives in the setting of COVID. Last test was positive for COVID on Monday.  The first time she got COVID she had hives for 6 months. She has history of hives and different allergies.  She noticed today that she started having some lip swelling.  Took some Benadryl about 2 hours ago which didn't help.  Takes Zrytec daily.  Her dermatologist had her on Zrytec, steroids and benadryl to control the hives.   Denies chest pain, shortness of breath, nausea, vomiting, diarrhea, palpitations or difficulty swallowing. No new foods, drinks or medications.   Past Medical History:  Diagnosis Date   Actinic keratosis    Basal cell carcinoma 02/22/2020   right lower leg superficial    Cancer (HCC)    basal cell skin cancer   Carpal tunnel syndrome    Gastritis    GERD (gastroesophageal reflux disease)    History of hepatitis A    Hx of basal cell carcinoma 02/16/2019   Left upper back sup. med. scapula. Infiltrative. Excised 04/12/2019   Hx of dysplastic nevus 01/07/2007   Right pretibial    Hyperlipidemia 10/17/2020   Melanocytic nevus 02/21/2019   right neck   Migraines    Other and unspecified angina pectoris    Unspecified essential hypertension     Patient Active Problem List   Diagnosis Date Noted   Esophageal dysphagia 03/05/2022   Gastric erythema 03/05/2022   Esophageal lesion 03/05/2022   Mucosal abnormality of duodenum 03/05/2022   Allergy 10/17/2020   Carpal tunnel syndrome 10/17/2020   Diabetes mellitus without complication (HCC) 10/17/2020   Hepatitis A 10/17/2020   Hyperlipidemia 10/17/2020   Migraines 10/17/2020   Acquired trigger finger of left ring finger 07/30/2020   Pain of left hand 06/25/2020   Pain  of left hip joint 11/16/2018   S/P left knee arthroscopy 08/11/2017   Acute pain of left knee 06/04/2017   Iliotibial band syndrome of left side 06/04/2017   Essential hypertension     Past Surgical History:  Procedure Laterality Date   basel cell removal     CHOLECYSTECTOMY     colonoscopy     COLONOSCOPY WITH PROPOFOL  N/A 03/30/2017   Procedure: COLONOSCOPY WITH PROPOFOL ;  Surgeon: Viktoria Lamar DASEN, MD;  Location: [facility] ENDOSCOPY;  Service: Endoscopy;  Laterality: N/A;   COLONOSCOPY WITH PROPOFOL  N/A 05/14/2022   Procedure: COLONOSCOPY WITH PROPOFOL ;  Surgeon: Unk Corinn Skiff, MD;  Location: [facility] ENDOSCOPY;  Service: Gastroenterology;  Laterality: N/A;   ESOPHAGOGASTRODUODENOSCOPY (EGD) WITH PROPOFOL  N/A 03/30/2017   Procedure: ESOPHAGOGASTRODUODENOSCOPY (EGD) WITH PROPOFOL ;  Surgeon: Viktoria Lamar DASEN, MD;  Location: [facility] ENDOSCOPY;  Service: Endoscopy;  Laterality: N/A;   ESOPHAGOGASTRODUODENOSCOPY (EGD) WITH PROPOFOL  N/A 03/05/2022   Procedure: ESOPHAGOGASTRODUODENOSCOPY (EGD) WITH PROPOFOL ;  Surgeon: Unk Corinn Skiff, MD;  Location: [facility] ENDOSCOPY;  Service: Gastroenterology;  Laterality: N/A;   FOOT SURGERY     x2   FRACTURE SURGERY     GALLBLADDER SURGERY     ORIF SHOULDER FRACTURE     UPPER GI ENDOSCOPY     VAGINAL HYSTERECTOMY  03/10/1984   endometriosis    OB History   No obstetric history on file.  Home Medications    Prior to Admission medications   Medication Sig Start Date End Date Taking? Authorizing Provider  albuterol (VENTOLIN HFA) 108 (90 Base) MCG/ACT inhaler Inhale 2 inhalations into the lungs every 6 (six) hours as needed for Wheezing 05/18/23 05/17/24 Yes [provider]  cetirizine  (ZYRTEC ) 10 MG tablet Take 1 tablet (10 mg total) by mouth 2 (two) times daily. 11/25/23  Yes Jaz Mallick, DO  predniSONE  (DELTASONE ) 10 MG tablet Take 6 tabs by mouth daily  for 2 days, then 5 tabs for 2 days, then 4 tabs for 2 days, then 3 tabs for 2  days, 2 tabs for 2 days, then 1 tab by mouth daily for 2 days 11/25/23  Yes Cheryln Balcom, DO  calcium-vitamin D (OSCAL WITH D) 500-200 MG-UNIT tablet Take 1 tablet by mouth 2 (two) times daily.    [provider]  indapamide (LOZOL) 1.25 MG tablet Take 1.25 mg by mouth daily. 09/28/20   [provider]  losartan (COZAAR) 100 MG tablet Take 100 mg by mouth daily. 09/26/20   [provider]  metFORMIN (GLUCOPHAGE-XR) 500 MG 24 hr tablet Take 500 mg by mouth 2 (two) times daily. 09/26/20   [provider]  metoprolol  succinate (TOPROL -XL) 50 MG 24 hr tablet Take 50 mg by mouth daily. Take with or immediately following a meal.    [provider]  Multiple Vitamin (MULTIVITAMIN) tablet Take 1 tablet by mouth daily.    [provider]  pantoprazole  (PROTONIX ) 40 MG tablet Take 1 tablet (40 mg total) by mouth 2 (two) times daily for 14 days. 02/25/22 03/20/22  Angelena Smalls, MD  sucralfate  (CARAFATE ) 1 GM/10ML suspension Take 10 mLs (1 g total) by mouth 4 (four) times daily for 10 days. Patient not taking: Reported on 03/20/2022 02/25/22 03/20/22  Angelena Smalls, MD    Family History Family History  Problem Relation Age of Onset   Hypertension Mother    Arrhythmia Brother    Hyperlipidemia Brother    Hypertension Brother    Breast cancer Neg Hx     Social History Social History   Tobacco Use   Smoking status: Never   Smokeless tobacco: Never  Vaping Use   Vaping status: Never Used  Substance Use Topics   Alcohol use: No   Drug use: No     Allergies   Fluconazole, Levofloxacin, Lidocaine , Aleve [naproxen sodium], Augmentin [amoxicillin-pot clavulanate], Cefuroxime, and Naprosyn [naproxen]   Review of Systems Review of Systems :negative unless otherwise stated in HPI.      Physical Exam Triage Vital Signs ED Triage Vitals  Encounter Vitals Group     BP 11/25/23 1816 (!) 117/59     Girls Systolic BP Percentile --      Girls  Diastolic BP Percentile --      Boys Systolic BP Percentile --      Boys Diastolic BP Percentile --      Pulse Rate 11/25/23 1816 68     Resp 11/25/23 1816 16     Temp 11/25/23 1816 98.3 F (36.8 C)     Temp Source 11/25/23 1816 Oral     SpO2 11/25/23 1816 98 %     Weight 11/25/23 1815 228 lb 3.2 oz (103.5 kg)     Height --      Head Circumference --      Peak Flow --      Pain Score 11/25/23 1825 0     Pain Loc --  Pain Education --      Exclude from Growth Chart --    No data found.  Updated Vital Signs BP (!) 117/59 (BP Location: Left Wrist)   Pulse 68   Temp 98.3 F (36.8 C) (Oral)   Resp 16   Wt 103.5 kg   SpO2 98%   BMI 38.57 kg/m   Visual Acuity Right Eye Distance:   Left Eye Distance:   Bilateral Distance:    Right Eye Near:   Left Eye Near:    Bilateral Near:     Physical Exam  GEN: alert, well appearing female, in no acute distress  EYES: no scleral injection or discharge HENT: Moist mucous membranes, no uvular edema, no oropharyngeal erythema or lesions, no tonsillar hypertrophy, no nasal discharge, upper lip edema present CV: regular rate and rhythm  RESP: no increased work of breathing, clear to ascultation bilaterally MSK: no extremity edema  NEURO: alert, moves all extremities appropriately SKIN: warm and dry    UC Treatments / Results  Labs (all labs ordered are listed, but only abnormal results are displayed) Labs Reviewed - No data to display  EKG   Radiology No results found.  Procedures Procedures (including critical care time)  Medications Ordered in UC Medications  dexamethasone  (DECADRON ) injection 10 mg (10 mg Intramuscular Given 11/25/23 1850)    Initial Impression / Assessment and Plan / UC Course  I have reviewed the triage vital signs and the nursing notes.  Pertinent labs & imaging results that were available during my care of the patient were reviewed by me and considered in my medical decision making (see  chart for details).      Patient is a 78 y.o. female who presents for lip edema.  Overall, patient is well-appearing and well-hydrated.  Vital signs stable.  Cheryl Key is afebrile.  History and exam concerning for allergic reaction. Decadron  10 mg IM given.  Treat with prednisone  taper and Zyrtec  twice a day for additional itch relief. No sign of infection to suggest antifungals or antibiotics at this time.    Reviewed expectations regarding course of current medical issues.  All questions asked were answered.  Outlined signs and symptoms indicating need for more acute intervention. Patient verbalized understanding. After Visit Summary given.   Final Clinical Impressions(s) / UC Diagnoses   Final diagnoses:  Lip swelling  Urticaria  Positive self-administered antigen test for COVID-19     Discharge Instructions      Start your prednisone  tomorrow. Take Zrytec twice a day. Use Benadryl as needed.  Follow up with your dermatologist.   Go to ED for red flag symptoms, including; fevers you cannot reduce with Tylenol/Motrin, severe headaches, vision changes, numbness/weakness in part of the body, lethargy, confusion, intractable vomiting, severe dehydration, chest pain, breathing difficulty, severe persistent abdominal or pelvic pain, signs of severe infection (increased redness, swelling of an area), feeling faint or passing out, dizziness, etc. You should especially go to the ED for sudden acute worsening of condition if you do not elect to go at this time.       ED Prescriptions     Medication Sig Dispense Auth. Provider   predniSONE  (DELTASONE ) 10 MG tablet Take 6 tabs by mouth daily  for 2 days, then 5 tabs for 2 days, then 4 tabs for 2 days, then 3 tabs for 2 days, 2 tabs for 2 days, then 1 tab by mouth daily for 2 days 168 tablet Lanea Vankirk, DO   cetirizine  (  ZYRTEC ) 10 MG tablet Take 1 tablet (10 mg total) by mouth 2 (two) times daily. 60 tablet Estephany Perot, DO       PDMP not reviewed this encounter.              Sylvanna Burggraf, DO 11/27/23 1835

## 2024-01-07 DIAGNOSIS — S8002XA Contusion of left knee, initial encounter: Secondary | ICD-10-CM | POA: Diagnosis not present

## 2024-01-07 DIAGNOSIS — M25552 Pain in left hip: Secondary | ICD-10-CM | POA: Diagnosis not present

## 2024-01-18 DIAGNOSIS — E119 Type 2 diabetes mellitus without complications: Secondary | ICD-10-CM | POA: Diagnosis not present

## 2024-01-18 DIAGNOSIS — H5203 Hypermetropia, bilateral: Secondary | ICD-10-CM | POA: Diagnosis not present

## 2024-01-18 DIAGNOSIS — H04123 Dry eye syndrome of bilateral lacrimal glands: Secondary | ICD-10-CM | POA: Diagnosis not present

## 2024-01-18 DIAGNOSIS — H53022 Refractive amblyopia, left eye: Secondary | ICD-10-CM | POA: Diagnosis not present

## 2024-01-18 DIAGNOSIS — H2513 Age-related nuclear cataract, bilateral: Secondary | ICD-10-CM | POA: Diagnosis not present

## 2024-02-17 ENCOUNTER — Encounter: Payer: Self-pay | Admitting: Dermatology

## 2024-02-17 ENCOUNTER — Ambulatory Visit: Payer: PPO | Admitting: Dermatology

## 2024-02-17 DIAGNOSIS — L578 Other skin changes due to chronic exposure to nonionizing radiation: Secondary | ICD-10-CM

## 2024-02-17 DIAGNOSIS — D492 Neoplasm of unspecified behavior of bone, soft tissue, and skin: Secondary | ICD-10-CM

## 2024-02-17 DIAGNOSIS — Z79899 Other long term (current) drug therapy: Secondary | ICD-10-CM

## 2024-02-17 DIAGNOSIS — C44719 Basal cell carcinoma of skin of left lower limb, including hip: Secondary | ICD-10-CM

## 2024-02-17 DIAGNOSIS — Z7189 Other specified counseling: Secondary | ICD-10-CM

## 2024-02-17 DIAGNOSIS — L820 Inflamed seborrheic keratosis: Secondary | ICD-10-CM

## 2024-02-17 DIAGNOSIS — L821 Other seborrheic keratosis: Secondary | ICD-10-CM

## 2024-02-17 DIAGNOSIS — Z1283 Encounter for screening for malignant neoplasm of skin: Secondary | ICD-10-CM

## 2024-02-17 DIAGNOSIS — Z85828 Personal history of other malignant neoplasm of skin: Secondary | ICD-10-CM

## 2024-02-17 DIAGNOSIS — Z86018 Personal history of other benign neoplasm: Secondary | ICD-10-CM

## 2024-02-17 DIAGNOSIS — B353 Tinea pedis: Secondary | ICD-10-CM

## 2024-02-17 DIAGNOSIS — C4491 Basal cell carcinoma of skin, unspecified: Secondary | ICD-10-CM

## 2024-02-17 DIAGNOSIS — L729 Follicular cyst of the skin and subcutaneous tissue, unspecified: Secondary | ICD-10-CM

## 2024-02-17 HISTORY — DX: Basal cell carcinoma of skin, unspecified: C44.91

## 2024-02-17 MED ORDER — KETOCONAZOLE 2 % EX CREA: TOPICAL_CREAM | CUTANEOUS | 11 refills | Status: AC

## 2024-02-17 NOTE — Patient Instructions (Addendum)
 Cryotherapy Aftercare  Wash gently with soap and water  everyday.   Apply Vaseline daily daily until healed.    Start Ketoconazole  cream apply to both feet and between toes every night at bedtime.   Wound Care Instructions  Cleanse wound gently with soap and water  once a day then pat dry with clean gauze. Apply a thin coat of Petrolatum (petroleum jelly, Vaseline) over the wound (unless you have an allergy to this). We recommend that you use a new, sterile tube of Vaseline. Do not pick or remove scabs. Do not remove the yellow or white healing tissue from the base of the wound.  Cover the wound with fresh, clean, nonstick gauze and secure with paper tape. You may use Band-Aids in place of gauze and tape if the wound is small enough, but would recommend trimming much of the tape off as there is often too much. Sometimes Band-Aids can irritate the skin.  You should call the office for your biopsy report after 1 week if you have not already been contacted.  If you experience any problems, such as abnormal amounts of bleeding, swelling, significant bruising, significant pain, or evidence of infection, please call the office immediately.  FOR ADULT SURGERY PATIENTS: If you need something for pain relief you may take 1 extra strength Tylenol (acetaminophen) AND 2 Ibuprofen (200mg  each) together every 4 hours as needed for pain. (do not take these if you are allergic to them or if you have a reason you should not take them.) Typically, you may only need pain medication for 1 to 3 days.      Recommend daily broad spectrum sunscreen SPF 30+ to sun-exposed areas, reapply every 2 hours as needed. Call for new or changing lesions.  Staying in the shade or wearing long sleeves, sun glasses (UVA+UVB protection) and wide brim hats (4-inch brim around the entire circumference of the hat) are also recommended for sun protection.      Melanoma ABCDEs  Melanoma is the most dangerous type of skin  cancer, and is the leading cause of death from skin disease.  You are more likely to develop melanoma if you: Have light-colored skin, light-colored eyes, or red or blond hair Spend a lot of time in the sun Tan regularly, either outdoors or in a tanning bed Have had blistering sunburns, especially during childhood Have a close family member who has had a melanoma Have atypical moles or large birthmarks  Early detection of melanoma is key since treatment is typically straightforward and cure rates are extremely high if we catch it early.   The first sign of melanoma is often a change in a mole or a new dark spot.  The ABCDE system is a way of remembering the signs of melanoma.  A for asymmetry:  The two halves do not match. B for border:  The edges of the growth are irregular. C for color:  A mixture of colors are present instead of an even brown color. D for diameter:  Melanomas are usually (but not always) greater than 6mm - the size of a pencil eraser. E for evolution:  The spot keeps changing in size, shape, and color.  Please check your skin once per month between visits. You can use a small mirror in front and a large mirror behind you to keep an eye on the back side or your body.   If you see any new or changing lesions before your next follow-up, please call to schedule a visit.  Please continue daily skin protection including broad spectrum sunscreen SPF 30+ to sun-exposed areas, reapplying every 2 hours as needed when you're outdoors.   Staying in the shade or wearing long sleeves, sun glasses (UVA+UVB protection) and wide brim hats (4-inch brim around the entire circumference of the hat) are also recommended for sun protection.      Due to recent changes in healthcare laws, you may see results of your pathology and/or laboratory studies on MyChart before the doctors have had a chance to review them. We understand that in some cases there may be results that are confusing or  concerning to you. Please understand that not all results are received at the same time and often the doctors may need to interpret multiple results in order to provide you with the best plan of care or course of treatment. Therefore, we ask that you please give us  2 business days to thoroughly review all your results before contacting the office for clarification. Should we see a critical lab result, you will be contacted sooner.   If You Need Anything After Your Visit  If you have any questions or concerns for your doctor, please call our main line at [phone] and press option 4 to reach your doctor's medical assistant. If no one answers, please leave a voicemail as directed and we will return your call as soon as possible. Messages left after 4 pm will be answered the following business day.   You may also send us  a message via MyChart. We typically respond to MyChart messages within 1-2 business days.  For prescription refills, please ask your pharmacy to contact our office. Our fax number is [phone].  If you have an urgent issue when the clinic is closed that cannot wait until the next business day, you can page your doctor at the number below.    Please note that while we do our best to be available for urgent issues outside of office hours, we are not available 24/7.   If you have an urgent issue and are unable to reach us , you may choose to seek medical care at your doctor's office, retail clinic, urgent care center, or emergency room.  If you have a medical emergency, please immediately call 911 or go to the emergency department.  Pager Numbers  - Dr. Hester: [phone]  - Dr. Jackquline: [phone]  - Dr. Claudene: [phone]   - Dr. Raymund: [phone]  In the event of inclement weather, please call our main line at [phone] for an update on the status of any delays or closures.  Dermatology Medication Tips: Please keep the boxes that topical medications come  in in order to help keep track of the instructions about where and how to use these. Pharmacies typically print the medication instructions only on the boxes and not directly on the medication tubes.   If your medication is too expensive, please contact our office at [phone] option 4 or send us  a message through MyChart.   We are unable to tell what your co-pay for medications will be in advance as this is different depending on your insurance coverage. However, we may be able to find a substitute medication at lower cost or fill out paperwork to get insurance to cover a needed medication.   If a prior authorization is required to get your medication covered by your insurance company, please allow us  1-2 business days to complete this process.  Drug prices often vary depending on where the prescription is filled  and some pharmacies may offer cheaper prices.  The website www.goodrx.com contains coupons for medications through different pharmacies. The prices here do not account for what the cost may be with help from insurance (it may be cheaper with your insurance), but the website can give you the price if you did not use any insurance.  - You can print the associated coupon and take it with your prescription to the pharmacy.  - You may also stop by our office during regular business hours and pick up a GoodRx coupon card.  - If you need your prescription sent electronically to a different pharmacy, notify our office through [facility] or by phone at [phone] option 4.     Si Usted Necesita Algo Despus de Su Visita  Tambin puede enviarnos un mensaje a travs de Clinical Cytogeneticist. Por lo general respondemos a los mensajes de MyChart en el transcurso de 1 a 2 das hbiles.  Para renovar recetas, por favor pida a su farmacia que se ponga en contacto con nuestra oficina. Randi lakes de fax es [place] [phone].  Si tiene un asunto urgente cuando la clnica est cerrada y que no puede  esperar hasta el siguiente da hbil, puede llamar/localizar a su doctor(a) al nmero que aparece a continuacin.   Por favor, tenga en cuenta que aunque hacemos todo lo posible para estar disponibles para asuntos urgentes fuera del horario de [place], no estamos disponibles las 24 horas del da, los 7 [address]  [address] de la [place].   Si tiene un problema urgente y no puede comunicarse con nosotros, puede optar por buscar atencin mdica  en el consultorio de su doctor(a), en una clnica privada, en un centro de atencin urgente o en una sala de emergencias.  Si tiene engineer, drilling, por favor llame inmediatamente al 911 o vaya a la sala de emergencias.  Nmeros de bper  - Dr. Hester: [phone]  - Dra. Jackquline: [phone]  - Dr. Claudene: [phone]  - Dra. Kitts: [phone]  En caso de inclemencias del [place], por favor llame a nuestra lnea principal al [phone] para una actualizacin sobre el estado de cualquier retraso o cierre.  Consejos para la medicacin en dermatologa: Por favor, guarde las cajas en las que vienen los medicamentos de uso tpico para ayudarle a seguir las instrucciones sobre dnde y cmo usarlos. Las farmacias generalmente imprimen las instrucciones del medicamento slo en las cajas y no directamente en los tubos del [place].   Si su medicamento es muy caro, por favor, pngase en contacto con landry rieger llamando al [phone] y presione la opcin 4 o envenos un mensaje a travs de Clinical Cytogeneticist.   No podemos decirle cul ser su copago por los medicamentos por adelantado ya que esto es diferente dependiendo de la cobertura de su seguro. Sin embargo, es posible que podamos encontrar un medicamento sustituto a audiological scientist un formulario para que el seguro cubra el medicamento que se considera necesario.   Si se requiere una autorizacin previa para que su compaa de seguros cubra su medicamento, por favor permtanos de 1 a 2 das hbiles para  completar este proceso.  Los precios de los medicamentos varan con frecuencia dependiendo del environmental consultant de dnde se surte la receta y alguna farmacias pueden ofrecer precios ms baratos.  El sitio web www.goodrx.com tiene cupones para medicamentos de health and safety inspector. Los precios aqu no tienen en cuenta lo que podra costar con la ayuda del seguro (puede ser ms barato con su seguro), [employer]  sitio web puede darle el precio si no visual merchandiser.  - Puede imprimir el cupn correspondiente y llevarlo con su receta a la farmacia.  - Tambin puede pasar por nuestra oficina durante el horario de atencin regular y education officer, museum una tarjeta de cupones de GoodRx.  - Si necesita que su receta se enve electrnicamente a una farmacia diferente, informe a nuestra oficina a travs de MyChart de [place] o por telfono llamando al [phone] y presione la opcin 4.

## 2024-02-17 NOTE — Progress Notes (Signed)
 Follow-Up Visit   Subjective  Cheryl Key is a 78 y.o. female who presents for the following: Skin Cancer Screening and Full Body Skin Exam. Hx of BCC. Hx of DN. Hx of AKs.  She reports a white spot on right side of lip. Reports spot on left leg/ankle.   The patient presents for Total-Body Skin Exam (TBSE) for skin cancer screening and mole check. The patient has spots, moles and lesions to be evaluated, some may be new or changing and the patient may have concern these could be cancer.  The following portions of the chart were reviewed this encounter and updated as appropriate: medications, allergies, medical history  Review of Systems:  No other skin or systemic complaints except as noted in HPI or Assessment and Plan.  Objective  Well appearing patient in no apparent distress; mood and affect are within normal limits.  A full examination was performed including scalp, head, eyes, ears, nose, lips, neck, chest, axillae, abdomen, back, buttocks, bilateral upper extremities, bilateral lower extremities, hands, feet, fingers, toes, fingernails, and toenails. All findings within normal limits unless otherwise noted below.   Exam of nails limited by presence of nail polish.   Relevant physical exam findings are noted in the Assessment and Plan.  Right Lower Eyelid margin x1 Erythematous keratotic or waxy stuck-on papule or plaque. Left Lower Leg - Posterior 0.8 cm pink crusted papule   Assessment & Plan   SKIN CANCER SCREENING PERFORMED TODAY.  HISTORY OF BASAL CELL CARCINOMA OF THE SKIN. Left upper back sup. med. scapula. Infiltrative. Excised 04/12/2019. Right lower leg, superficial, 02/22/2020. - No evidence of recurrence today - Recommend regular full body skin exams - Recommend daily broad spectrum sunscreen SPF 30+ to sun-exposed areas, reapply every 2 hours as needed.  - Call if any new or changing lesions are noted between office visits  HISTORY OF DYSPLASTIC NEVUS.  Right pretibial. 01/07/2007. No evidence of recurrence today Recommend regular full body skin exams Recommend daily broad spectrum sunscreen SPF 30+ to sun-exposed areas, reapply every 2 hours as needed.  Call if any new or changing lesions are noted between office visits   ACTINIC DAMAGE - Chronic condition, secondary to cumulative UV/sun exposure - diffuse scaly erythematous macules with underlying dyspigmentation - Recommend daily broad spectrum sunscreen SPF 30+ to sun-exposed areas, reapply every 2 hours as needed.  - Staying in the shade or wearing long sleeves, sun glasses (UVA+UVB protection) and wide brim hats (4-inch brim around the entire circumference of the hat) are also recommended for sun protection.  - Call for new or changing lesions.  LENTIGINES, SEBORRHEIC KERATOSES, HEMANGIOMAS - Benign normal skin lesions - Benign-appearing - Call for any changes  MELANOCYTIC NEVI - Tan-brown and/or pink-flesh-colored symmetric macules and papules - Benign appearing on exam today - Observation - Call clinic for new or changing moles - Recommend daily use of broad spectrum spf 30+ sunscreen to sun-exposed areas.  - Check nails when remove polish.   EPIDERMAL INCLUSION CYST Exam: 3 mm cystic papule at right upper lip near oral commissure  Benign-appearing. Exam most consistent with an epidermal inclusion cyst. Discussed that a cyst is a benign growth that can grow over time and sometimes get irritated or inflamed. Recommend observation if it is not bothersome. Discussed option of surgical excision to remove it if it is growing, symptomatic, or other changes noted. Please call for new or changing lesions so they can be evaluated.   Acrochordons (Skin Tags) - Fleshy,  skin-colored pedunculated papules - Benign appearing.  - Observe. - If desired, they can be removed with an in office procedure that is not covered by insurance. - Please call the clinic if you notice any new or  changing lesions.   TINEA PEDIS Chronic and persistent condition with duration or expected duration over one year. Condition is symptomatic / bothersome to patient. Not to goal. Exam: Scaling and maceration web spaces and over distal and lateral soles. Treatment Plan: Start Ketoconazole  cream apply to both feet and between toes every night at bedtime.   INFLAMED SEBORRHEIC KERATOSIS Right Lower Eyelid margin x1 Symptomatic, irritating, patient would like treated. - Destruction of lesion - Right Lower Eyelid margin x1 Complexity: simple   Destruction method: cryotherapy   Informed consent: discussed and consent obtained   Timeout:  patient name, date of birth, surgical site, and procedure verified Lesion destroyed using liquid nitrogen: Yes   Region frozen until ice ball extended beyond lesion: Yes   Outcome: patient tolerated procedure well with no complications   Post-procedure details: wound care instructions given   Additional details:  Prior to procedure, discussed risks of blister formation, small wound, skin dyspigmentation, or rare scar following cryotherapy. Recommend Vaseline ointment to treated areas while healing.   NEOPLASM OF SKIN Left Lower Leg - Posterior - Epidermal / dermal shaving  Lesion diameter (cm):  0.8 Informed consent: discussed and consent obtained   Timeout: patient name, date of birth, surgical site, and procedure verified   Procedure prep:  Patient was prepped and draped in usual sterile fashion Prep type:  Isopropyl alcohol Anesthesia: the lesion was anesthetized in a standard fashion   Anesthetic:  1% lidocaine  w/ epinephrine  1-100,000 buffered w/ 8.4% NaHCO3 Instrument used: flexible razor blade   Hemostasis achieved with: pressure, aluminum chloride and electrodesiccation   Outcome: patient tolerated procedure well   Post-procedure details: sterile dressing applied and wound care instructions given   Dressing type: bandage and petrolatum    -  Destruction of lesion Complexity: extensive   Destruction method: electrodesiccation and curettage   Informed consent: discussed and consent obtained   Timeout:  patient name, date of birth, surgical site, and procedure verified Procedure prep:  Patient was prepped and draped in usual sterile fashion Prep type:  Isopropyl alcohol Anesthesia: the lesion was anesthetized in a standard fashion   Anesthetic:  1% lidocaine  w/ epinephrine  1-100,000 buffered w/ 8.4% NaHCO3 Curettage performed in three different directions: Yes   Electrodesiccation performed over the curetted area: Yes   Curettage cycles:  3 Lesion length (cm):  0.8 Lesion width (cm):  0.8 Margin per side (cm):  0.2 Final wound size (cm):  1.2 Hemostasis achieved with:  pressure and aluminum chloride Outcome: patient tolerated procedure well with no complications   Post-procedure details: sterile dressing applied and wound care instructions given   Dressing type: bandage and petrolatum    Specimen 1 - Surgical pathology Differential Diagnosis: R/O BCC  Check Margins: Yes  EDC today TINEA PEDIS OF BOTH FEET   COUNSELING AND COORDINATION OF CARE   MEDICATION MANAGEMENT   SKIN CANCER SCREENING   HISTORY OF BASAL CELL CARCINOMA   ACTINIC SKIN DAMAGE   CYST OF SKIN   SEBORRHEIC KERATOSIS   HISTORY OF DYSPLASTIC NEVUS   BASAL CELL CARCINOMA (BCC) OF LEFT LOWER LEG    Return in about 1 year (around 02/16/2025) for TBSE, HxBCC, HxDN, HxAK.  I, Jill Parcell, CMA, am acting as scribe for Alm Rhyme, MD.  Documentation: I have reviewed the above documentation for accuracy and completeness, and I agree with the above.  Alm Rhyme, MD

## 2024-02-19 LAB — SURGICAL PATHOLOGY

## 2024-02-22 ENCOUNTER — Ambulatory Visit: Payer: Self-pay | Admitting: Dermatology

## 2024-02-23 ENCOUNTER — Encounter: Payer: Self-pay | Admitting: Dermatology

## 2024-02-23 NOTE — Telephone Encounter (Addendum)
 Patient returned call. Went over [employer]. Will recheck at next followup  ----- Message from Alm Rhyme, MD sent at 02/22/2024  5:47 PM EST ----- FINAL DIAGNOSIS        1. Skin, left lower leg - posterior :       BASAL CELL CARCINOMA, SUPERFICIAL AND NODULAR PATTERNS, BASE INVOLVED   Cancer = BCC Already treated Recheck next vsiit

## 2024-02-23 NOTE — Telephone Encounter (Addendum)
 Tried calling patient regarding results. No answer. Lm for patient to return cal. ----- Message from Alm Rhyme, MD sent at 02/22/2024  5:47 PM EST ----- FINAL DIAGNOSIS        1. Skin, left lower leg - posterior :       BASAL CELL CARCINOMA, SUPERFICIAL AND NODULAR PATTERNS, BASE INVOLVED   Cancer = BCC Already treated Recheck next vsiit

## 2024-03-16 NOTE — Progress Notes (Signed)
 Cheryl Key                                          MRN: [id number]   03/16/2024   The [facility] Quality Team Specialist reviewed this patient medical record for the purposes of chart review for care gap closure. The following were reviewed: chart review for care gap closure-kidney health evaluation for diabetes:eGFR  and uACR.    [facility] Quality Team

## 2025-02-16 ENCOUNTER — Ambulatory Visit: Admitting: Dermatology
